# Patient Record
Sex: Male | Born: 1965 | Race: Black or African American | Hispanic: No | Marital: Married | Smoking: Current every day smoker
Health system: Southern US, Community
[De-identification: ages and names within clinical notes are randomized; demographics above are authoritative.]

## PROBLEM LIST (undated history)

## (undated) DIAGNOSIS — N400 Enlarged prostate without lower urinary tract symptoms: Secondary | ICD-10-CM

## (undated) DIAGNOSIS — N289 Disorder of kidney and ureter, unspecified: Secondary | ICD-10-CM

## (undated) DIAGNOSIS — A048 Other specified bacterial intestinal infections: Secondary | ICD-10-CM

## (undated) DIAGNOSIS — K259 Gastric ulcer, unspecified as acute or chronic, without hemorrhage or perforation: Secondary | ICD-10-CM

## (undated) DIAGNOSIS — K222 Esophageal obstruction: Secondary | ICD-10-CM

## (undated) DIAGNOSIS — I10 Essential (primary) hypertension: Secondary | ICD-10-CM

## (undated) DIAGNOSIS — K635 Polyp of colon: Secondary | ICD-10-CM

## (undated) DIAGNOSIS — R519 Headache, unspecified: Secondary | ICD-10-CM

## (undated) DIAGNOSIS — R51 Headache: Secondary | ICD-10-CM

## (undated) HISTORY — DX: Other specified bacterial intestinal infections: A04.8

## (undated) HISTORY — DX: Benign prostatic hyperplasia without lower urinary tract symptoms: N40.0

## (undated) HISTORY — DX: Polyp of colon: K63.5

## (undated) HISTORY — DX: Esophageal obstruction: K22.2

## (undated) HISTORY — DX: Disorder of kidney and ureter, unspecified: N28.9

## (undated) HISTORY — DX: Essential (primary) hypertension: I10

---

## 2011-11-04 ENCOUNTER — Emergency Department (HOSPITAL_COMMUNITY)
Admission: EM | Admit: 2011-11-04 | Discharge: 2011-11-04 | Disposition: A | Discharge status: Home / Self Care | Payer: Self-pay | Attending: Emergency Medicine | Admitting: Emergency Medicine

## 2011-11-04 ENCOUNTER — Emergency Department (HOSPITAL_COMMUNITY): Payer: Self-pay

## 2011-11-04 ENCOUNTER — Encounter (HOSPITAL_COMMUNITY): Payer: Self-pay | Admitting: *Deleted

## 2011-11-04 DIAGNOSIS — K259 Gastric ulcer, unspecified as acute or chronic, without hemorrhage or perforation: Secondary | ICD-10-CM | POA: Insufficient documentation

## 2011-11-04 DIAGNOSIS — Z79899 Other long term (current) drug therapy: Secondary | ICD-10-CM | POA: Insufficient documentation

## 2011-11-04 DIAGNOSIS — F17200 Nicotine dependence, unspecified, uncomplicated: Secondary | ICD-10-CM | POA: Insufficient documentation

## 2011-11-04 LAB — DIFFERENTIAL
Eosinophils Absolute: 0.1 10*3/uL (ref 0.0–0.7)
Lymphocytes Relative: 19 % (ref 12–46)
Lymphs Abs: 2.6 10*3/uL (ref 0.7–4.0)
Monocytes Relative: 3 % (ref 3–12)
Neutrophils Relative %: 78 % — ABNORMAL HIGH (ref 43–77)

## 2011-11-04 LAB — COMPREHENSIVE METABOLIC PANEL
ALT: 18 U/L (ref 0–53)
Alkaline Phosphatase: 62 U/L (ref 39–117)
BUN: 8 mg/dL (ref 6–23)
CO2: 31 mEq/L (ref 19–32)
GFR calc Af Amer: >90 mL/min (ref >90)
GFR calc non Af Amer: 79 mL/min — ABNORMAL LOW (ref >90)
Glucose, Bld: 96 mg/dL (ref 70–99)
Potassium: 4.3 mEq/L (ref 3.5–5.1)
Sodium: 141 mEq/L (ref 135–145)
Total Bilirubin: 0.5 mg/dL (ref 0.3–1.2)
Total Protein: 7.8 g/dL (ref 6.0–8.3)

## 2011-11-04 LAB — LIPASE, BLOOD: Lipase: 24 U/L (ref 11–59)

## 2011-11-04 LAB — CBC
Hemoglobin: 14.4 g/dL (ref 13.0–17.0)
MCH: 29.9 pg (ref 26.0–34.0)
MCV: 87.5 fL (ref 78.0–100.0)
Platelets: 224 10*3/uL (ref 150–400)
RBC: 4.81 MIL/uL (ref 4.22–5.81)
WBC: 13.9 10*3/uL — ABNORMAL HIGH (ref 4.0–10.5)

## 2011-11-04 MED ORDER — AMOXICILLIN 500 MG PO CAPS: 500.0000 mg | ORAL_CAPSULE | Freq: Three times a day (TID) | ORAL | Status: AC

## 2011-11-04 MED ORDER — ONDANSETRON HCL 4 MG/2ML IJ SOLN
4.0000 mg | Freq: Once | INTRAMUSCULAR | Status: AC
  Administered 2011-11-04: 4 mg via INTRAVENOUS
  Filled 2011-11-04: qty 2

## 2011-11-04 MED ORDER — FENTANYL CITRATE 0.05 MG/ML IJ SOLN
100.0000 ug | Freq: Once | INTRAMUSCULAR | Status: AC
  Administered 2011-11-04: 100 ug via INTRAVENOUS
  Filled 2011-11-04: qty 2

## 2011-11-04 MED ORDER — HYDROCODONE-ACETAMINOPHEN 5-325 MG PO TABS: 2.0000 | ORAL_TABLET | ORAL | Status: AC | PRN

## 2011-11-04 MED ORDER — METRONIDAZOLE 500 MG PO TABS: 500.0000 mg | ORAL_TABLET | Freq: Two times a day (BID) | ORAL | Status: AC

## 2011-11-04 MED ORDER — OMEPRAZOLE 20 MG PO CPDR: 20.0000 mg | DELAYED_RELEASE_CAPSULE | Freq: Every day | ORAL | Status: DC

## 2011-11-04 MED ORDER — IOHEXOL 300 MG/ML  SOLN
100.0000 mL | Freq: Once | INTRAMUSCULAR | Status: AC | PRN
  Administered 2011-11-04: 100 mL via INTRAVENOUS

## 2011-11-04 NOTE — ED Provider Notes (Signed)
History     CSN: [id number]  Arrival date & time 11/04/11  1557   First MD Initiated Contact with Patient 11/04/11 1646      Chief Complaint  Patient presents with  . Abdominal Pain     HPI Patient with history of abdominal pain for about two years but for the past few weeks it has been getting worse. Patient denies any n/v/d recently . Patient's pain is mostly on his upper mid abdomen area.  History reviewed. No pertinent past medical history.  History reviewed. No pertinent past surgical history.  History reviewed. No pertinent family history.  History  Substance Use Topics  . Smoking status: Current Everyday Smoker    Types: Cigarettes  . Smokeless tobacco: Not on file  . Alcohol Use: Yes      Review of Systems Negative except as noted in history of present illness Allergies  Review of patient's allergies indicates no known allergies.  Home Medications   Current Outpatient Rx  Name Route Sig Dispense Refill  . CALCIUM CARBONATE ANTACID 500 MG PO CHEW Oral Chew 1 tablet by mouth daily.    Marland Kitchen RANITIDINE HCL 150 MG PO TABS Oral Take 150 mg by mouth 2 (two) times daily.    . AMOXICILLIN 500 MG PO CAPS Oral Take 1 capsule (500 mg total) by mouth 3 (three) times daily. 42 capsule 0  . HYDROCODONE-ACETAMINOPHEN 5-325 MG PO TABS Oral Take 2 tablets by mouth every 4 (four) hours as needed for pain. 6 tablet 0  . OMEPRAZOLE 20 MG PO CPDR Oral Take 1 capsule (20 mg total) by mouth daily. 30 capsule 0    BP 130/84  Pulse 59  Temp(Src) 98.3 F (36.8 C) (Oral)  Resp 18  SpO2 98%  Physical Exam  Nursing note and vitals reviewed. Constitutional: He is oriented to person, place, and time. He appears well-developed and well-nourished. No distress.  HENT:  Head: Normocephalic and atraumatic.  Eyes: Pupils are equal, round, and reactive to light.  Neck: Normal range of motion.  Cardiovascular: Normal rate and intact distal pulses.   Pulmonary/Chest: No respiratory  distress.  Abdominal: Soft. Normal appearance and bowel sounds are normal. He exhibits no distension.    Musculoskeletal: Normal range of motion.  Neurological: He is alert and oriented to person, place, and time. No cranial nerve deficit.  Skin: Skin is warm and dry. No rash noted.  Psychiatric: He has a normal mood and affect. His behavior is normal.    ED Course  Procedures (including critical care time)  Labs Reviewed  CBC - Abnormal; Notable for the following:    WBC 13.9 (*)    All other components within normal limits  DIFFERENTIAL - Abnormal; Notable for the following:    Neutrophils Relative 78 (*)    Neutro Abs 10.8 (*)    All other components within normal limits  COMPREHENSIVE METABOLIC PANEL - Abnormal; Notable for the following:    Calcium 11.3 (*)    GFR calc non Af Amer 79 (*)    All other components within normal limits  LIPASE, BLOOD   Ct Abdomen Pelvis W Contrast  11/04/2011  *RADIOLOGY REPORT*  Clinical Data: 46 year old male with abdominal pain.  Mid upper abdomen affected.  CT ABDOMEN AND PELVIS WITH CONTRAST  Technique:  Multidetector CT imaging of the abdomen and pelvis was performed following the standard protocol during bolus administration of intravenous contrast.  Contrast: OMNIPAQUE IOHEXOL 300 MG/ML IV SOLN  Comparison: None.  Findings: Negative lung bases.  No pericardial or pleural effusion. The No acute osseous abnormality identified.  No pelvic free fluid.  Redundant sigmoid colon.  Otherwise negative distal colon.  Retained stool throughout much of the colon.  Normal appendix.  Normal terminal ileum.  Oral contrast has not yet reached the distal small bowel.  No dilated or inflamed small bowel loops.  The proximal stomach and gastroesophageal junction appear normal. Thickening of the gastric antrum may be physiologic. There is subtle stranding in the lesser sac at the level of the gastric antrum.  Pancreatic enhancement in this area appears  preserved. Nearby there are gastrohepatic ligament and root of mesentery lymph nodes which are at the upper limits of normal.  No free fluid, fluid collection, or pneumoperitoneum.  Duodenum within normal limits.  Negative gallbladder, liver, spleen, pancreas, adrenal glands, and portal venous system.  Major arterial structures are patent with minimal aortic atherosclerosis.  Kidneys are within normal limits for age; small left renal low density lesion likely is a simple cyst.  No lymphadenopathy.  IMPRESSION: 1.  Thickening of the gastric antrum which may be physiologic, but there is subtle inflammatory change in the adjacent lesser sac including reactive appearing lymph nodes.  Acute gastric ulcer disease cannot be excluded.  No evidence of perforation or pancreatitis. 2.  Otherwise negative for age CT of the abdomen pelvis.  Original Report Authenticated By: Harley Hallmark, M.D.     1. Gastric ulcer       MDM         Nelia Shi, MD 11/06/11 [phone]

## 2011-11-04 NOTE — ED Notes (Signed)
Patient with history of abdominal pain for about two years but for the past few weeks it has been getting worse.  Patient denies any n/v/d recently .  Patient's pain is mostly on his upper mid abdomen area.

## 2013-01-20 ENCOUNTER — Encounter (HOSPITAL_BASED_OUTPATIENT_CLINIC_OR_DEPARTMENT_OTHER): Payer: Self-pay

## 2013-01-20 ENCOUNTER — Emergency Department (HOSPITAL_BASED_OUTPATIENT_CLINIC_OR_DEPARTMENT_OTHER)
Admission: EM | Admit: 2013-01-20 | Discharge: 2013-01-20 | Disposition: A | Discharge status: Home / Self Care | Payer: 59 | Attending: Emergency Medicine | Admitting: Emergency Medicine

## 2013-01-20 ENCOUNTER — Emergency Department (HOSPITAL_BASED_OUTPATIENT_CLINIC_OR_DEPARTMENT_OTHER): Payer: 59

## 2013-01-20 DIAGNOSIS — F17200 Nicotine dependence, unspecified, uncomplicated: Secondary | ICD-10-CM | POA: Insufficient documentation

## 2013-01-20 DIAGNOSIS — E162 Hypoglycemia, unspecified: Secondary | ICD-10-CM

## 2013-01-20 LAB — COMPREHENSIVE METABOLIC PANEL
AST: 15 U/L (ref 0–37)
Albumin: 4.1 g/dL (ref 3.5–5.2)
Alkaline Phosphatase: 55 U/L (ref 39–117)
BUN: 14 mg/dL (ref 6–23)
Chloride: 106 mEq/L (ref 96–112)
Potassium: 3.9 mEq/L (ref 3.5–5.1)
Sodium: 141 mEq/L (ref 135–145)
Total Bilirubin: 1.1 mg/dL (ref 0.3–1.2)
Total Protein: 7.1 g/dL (ref 6.0–8.3)

## 2013-01-20 LAB — CBC WITH DIFFERENTIAL/PLATELET
Basophils Absolute: 0 10*3/uL (ref 0.0–0.1)
Basophils Relative: 0 % (ref 0–1)
Eosinophils Absolute: 0.1 10*3/uL (ref 0.0–0.7)
Hemoglobin: 14.7 g/dL (ref 13.0–17.0)
MCH: 30.5 pg (ref 26.0–34.0)
MCHC: 34.4 g/dL (ref 30.0–36.0)
Monocytes Relative: 5 % (ref 3–12)
Neutro Abs: 6.4 10*3/uL (ref 1.7–7.7)
Neutrophils Relative %: 78 % — ABNORMAL HIGH (ref 43–77)
Platelets: 176 10*3/uL (ref 150–400)
RDW: 13.8 % (ref 11.5–15.5)

## 2013-01-20 NOTE — ED Provider Notes (Signed)
History     CSN: [id number]  Arrival date & time 01/20/13  0804   First MD Initiated Contact with Patient 01/20/13 [phone]      Chief Complaint  Patient presents with  . Dizziness    (Consider location/radiation/quality/duration/timing/severity/associated sxs/prior treatment) The history is provided by the patient.   Patient here complaining of dizziness x3 days. Patient had sudden onset at rest when he felt the room was spinning. He denies any ear pain or visual changes. No unilateral weakness. No fever vomiting or neck pain. 90 chest pain shortness of breath. Symptoms have been intermittent and lasting up to one hour. No associated diaphoresis. No treatment used prior to arrival. Denies any severe headache at this time but does not a mild ache. History reviewed. No pertinent past medical history.  History reviewed. No pertinent past surgical history.  History reviewed. No pertinent family history.  History  Substance Use Topics  . Smoking status: Current Every Day Smoker    Types: Cigarettes  . Smokeless tobacco: Not on file  . Alcohol Use: Yes      Review of Systems  All other systems reviewed and are negative.    Allergies  Review of patient's allergies indicates no known allergies.  Home Medications   Current Outpatient Rx  Name  Route  Sig  Dispense  Refill  . calcium carbonate (TUMS - DOSED IN MG ELEMENTAL CALCIUM) 500 MG chewable tablet   Oral   Chew 1 tablet by mouth daily.         Marland Kitchen EXPIRED: omeprazole (PRILOSEC) 20 MG capsule   Oral   Take 1 capsule (20 mg total) by mouth daily.   30 capsule   0   . ranitidine (ZANTAC) 150 MG tablet   Oral   Take 150 mg by mouth 2 (two) times daily.           BP 118/77  Pulse 70  Temp(Src) 98.2 F (36.8 C) (Oral)  Resp 16  SpO2 100%  Physical Exam  Nursing note and vitals reviewed. Constitutional: He is oriented to person, place, and time. He appears well-developed and well-nourished.  Non-toxic  appearance. No distress.  HENT:  Head: Normocephalic and atraumatic.  Eyes: Conjunctivae, EOM and lids are normal. Pupils are equal, round, and reactive to light.  Neck: Normal range of motion. Neck supple. No tracheal deviation present. No mass present.  Cardiovascular: Normal rate, regular rhythm and normal heart sounds.  Exam reveals no gallop.   No murmur heard. Pulmonary/Chest: Effort normal and breath sounds normal. No stridor. No respiratory distress. He has no decreased breath sounds. He has no wheezes. He has no rhonchi. He has no rales.  Abdominal: Soft. Normal appearance and bowel sounds are normal. He exhibits no distension. There is no tenderness. There is no rebound and no CVA tenderness.  Musculoskeletal: Normal range of motion. He exhibits no edema and no tenderness.  Neurological: He is alert and oriented to person, place, and time. He has normal strength. No cranial nerve deficit or sensory deficit. GCS eye subscore is 4. GCS verbal subscore is 5. GCS motor subscore is 6.  Skin: Skin is warm and dry. No abrasion and no rash noted.  Psychiatric: He has a normal mood and affect. His speech is normal and behavior is normal.    ED Course  Procedures (including critical care time)  Labs Reviewed - No data to display No results found.   No diagnosis found.    MDM  Pt given  food for low blood sugar--labs and xrays reviewed in no acute findings noted with the exception of blood sugar. No concern for ACS or TIA. Patient stable for discharge        Toy Baker, MD 01/20/13 [phone]

## 2013-01-20 NOTE — ED Notes (Signed)
Pt states that he has been "foggy upstairs".  Pt c/o dizziness x several days.  Denies any nausea and vomiting, fever, chills, cough, congestion.

## 2013-12-18 DIAGNOSIS — R972 Elevated prostate specific antigen [PSA]: Secondary | ICD-10-CM | POA: Insufficient documentation

## 2014-05-11 ENCOUNTER — Emergency Department (HOSPITAL_BASED_OUTPATIENT_CLINIC_OR_DEPARTMENT_OTHER)
Admission: EM | Admit: 2014-05-11 | Discharge: 2014-05-11 | Disposition: A | Discharge status: Home / Self Care | Payer: 59 | Attending: Emergency Medicine | Admitting: Emergency Medicine

## 2014-05-11 ENCOUNTER — Emergency Department (HOSPITAL_BASED_OUTPATIENT_CLINIC_OR_DEPARTMENT_OTHER): Payer: 59

## 2014-05-11 ENCOUNTER — Encounter (HOSPITAL_BASED_OUTPATIENT_CLINIC_OR_DEPARTMENT_OTHER): Payer: Self-pay | Admitting: Emergency Medicine

## 2014-05-11 DIAGNOSIS — Z8719 Personal history of other diseases of the digestive system: Secondary | ICD-10-CM | POA: Diagnosis not present

## 2014-05-11 DIAGNOSIS — F17200 Nicotine dependence, unspecified, uncomplicated: Secondary | ICD-10-CM | POA: Insufficient documentation

## 2014-05-11 DIAGNOSIS — R1011 Right upper quadrant pain: Secondary | ICD-10-CM | POA: Insufficient documentation

## 2014-05-11 DIAGNOSIS — Z79899 Other long term (current) drug therapy: Secondary | ICD-10-CM | POA: Insufficient documentation

## 2014-05-11 HISTORY — DX: Headache, unspecified: R51.9

## 2014-05-11 HISTORY — DX: Gastric ulcer, unspecified as acute or chronic, without hemorrhage or perforation: K25.9

## 2014-05-11 HISTORY — DX: Headache: R51

## 2014-05-11 LAB — CBC WITH DIFFERENTIAL/PLATELET
BASOS ABS: 0 10*3/uL (ref 0.0–0.1)
BASOS PCT: 0 % (ref 0–1)
EOS ABS: 0.1 10*3/uL (ref 0.0–0.7)
Eosinophils Relative: 2 % (ref 0–5)
HCT: 41.2 % (ref 39.0–52.0)
Hemoglobin: 14 g/dL (ref 13.0–17.0)
LYMPHS ABS: 2 10*3/uL (ref 0.7–4.0)
Lymphocytes Relative: 25 % (ref 12–46)
MCH: 30.4 pg (ref 26.0–34.0)
MCHC: 34 g/dL (ref 30.0–36.0)
MCV: 89.4 fL (ref 78.0–100.0)
Monocytes Absolute: 0.4 10*3/uL (ref 0.1–1.0)
Monocytes Relative: 5 % (ref 3–12)
NEUTROS PCT: 69 % (ref 43–77)
Neutro Abs: 5.5 10*3/uL (ref 1.7–7.7)
PLATELETS: 200 10*3/uL (ref 150–400)
RBC: 4.61 MIL/uL (ref 4.22–5.81)
RDW: 14 % (ref 11.5–15.5)
WBC: 8 10*3/uL (ref 4.0–10.5)

## 2014-05-11 LAB — URINALYSIS, ROUTINE W REFLEX MICROSCOPIC
BILIRUBIN URINE: NEGATIVE
Glucose, UA: NEGATIVE mg/dL
Hgb urine dipstick: NEGATIVE
Ketones, ur: NEGATIVE mg/dL
Leukocytes, UA: NEGATIVE
NITRITE: NEGATIVE
PROTEIN: NEGATIVE mg/dL
SPECIFIC GRAVITY, URINE: 1.024 (ref 1.005–1.030)
UROBILINOGEN UA: 1 mg/dL (ref 0.0–1.0)
pH: 5.5 (ref 5.0–8.0)

## 2014-05-11 LAB — COMPREHENSIVE METABOLIC PANEL
ALBUMIN: 4.1 g/dL (ref 3.5–5.2)
ALK PHOS: 59 U/L (ref 39–117)
ALT: 24 U/L (ref 0–53)
AST: 19 U/L (ref 0–37)
Anion gap: 11 (ref 5–15)
BUN: 11 mg/dL (ref 6–23)
CALCIUM: 10.3 mg/dL (ref 8.4–10.5)
CO2: 25 mEq/L (ref 19–32)
Chloride: 104 mEq/L (ref 96–112)
Creatinine, Ser: 1 mg/dL (ref 0.50–1.35)
GFR calc Af Amer: >90 mL/min (ref >90)
GFR calc non Af Amer: 88 mL/min — ABNORMAL LOW (ref >90)
Glucose, Bld: 105 mg/dL — ABNORMAL HIGH (ref 70–99)
POTASSIUM: 4.1 meq/L (ref 3.7–5.3)
SODIUM: 140 meq/L (ref 137–147)
TOTAL PROTEIN: 7.8 g/dL (ref 6.0–8.3)
Total Bilirubin: 0.4 mg/dL (ref 0.3–1.2)

## 2014-05-11 LAB — LIPASE, BLOOD: Lipase: 34 U/L (ref 11–59)

## 2014-05-11 MED ORDER — CLARITHROMYCIN 500 MG PO TABS: 500.0000 mg | ORAL_TABLET | Freq: Two times a day (BID) | ORAL | Status: DC

## 2014-05-11 MED ORDER — PANTOPRAZOLE SODIUM 20 MG PO TBEC: 20.0000 mg | DELAYED_RELEASE_TABLET | Freq: Every day | ORAL | Status: DC

## 2014-05-11 MED ORDER — GI COCKTAIL ~~LOC~~
30.0000 mL | Freq: Once | ORAL | Status: AC
  Administered 2014-05-11: 30 mL via ORAL
  Filled 2014-05-11: qty 30

## 2014-05-11 MED ORDER — AMOXICILLIN 500 MG PO CAPS: 500.0000 mg | ORAL_CAPSULE | Freq: Three times a day (TID) | ORAL | Status: DC

## 2014-05-11 NOTE — Discharge Instructions (Signed)
Protonix as prescribed.  Followup with your primary Dr. if not improving in the next week, and return to the ER if you develop any new and concerning symptoms.   Abdominal Pain Many things can cause abdominal pain. Usually, abdominal pain is not caused by a disease and will improve without treatment. It can often be observed and treated at home. Your health care provider will do a physical exam and possibly order blood tests and X-rays to help determine the seriousness of your pain. However, in many cases, more time must pass before a clear cause of the pain can be found. Before that point, your health care provider may not know if you need more testing or further treatment. HOME CARE INSTRUCTIONS  Monitor your abdominal pain for any changes. The following actions may help to alleviate any discomfort you are experiencing:  Only take over-the-counter or prescription medicines as directed by your health care provider.  Do not take laxatives unless directed to do so by your health care provider.  Try a clear liquid diet (broth, tea, or water) as directed by your health care provider. Slowly move to a bland diet as tolerated. SEEK MEDICAL CARE IF:  You have unexplained abdominal pain.  You have abdominal pain associated with nausea or diarrhea.  You have pain when you urinate or have a bowel movement.  You experience abdominal pain that wakes you in the night.  You have abdominal pain that is worsened or improved by eating food.  You have abdominal pain that is worsened with eating fatty foods.  You have a fever. SEEK IMMEDIATE MEDICAL CARE IF:   Your pain does not go away within 2 hours.  You keep throwing up (vomiting).  Your pain is felt only in portions of the abdomen, such as the right side or the left lower portion of the abdomen.  You pass bloody or black tarry stools. MAKE SURE YOU:  Understand these instructions.   Will watch your condition.   Will get help right  away if you are not doing well or get worse.  Document Released: 07/05/2005 Document Revised: 09/30/2013 Document Reviewed: 06/04/2013 [facility] Patient Information 2015 [place], [place]. This information is not intended to replace advice given to you by your health care provider. Make sure you discuss any questions you have with your health care provider.

## 2014-05-11 NOTE — ED Notes (Signed)
Patient transported to Ultrasound 

## 2014-05-11 NOTE — ED Provider Notes (Signed)
CSN: [id number]     Arrival date & time 05/11/14  0740 History   First MD Initiated Contact with Patient 05/11/14 [phone]     Chief Complaint  Patient presents with  . Abdominal Pain     (Consider location/radiation/quality/duration/timing/severity/associated sxs/prior Treatment) HPI Comments: Patient is a 48 year old male with history of gastric ulcers. He presents with complaints of right-sided abdominal pain that has been occurring intermittently for the past 2 months. He states his pain is worsening despite taking Zantac daily. He denies any fevers or chills. He denies any bloody or black stools. He states he has a history of gastric ulcers and this is identical to what he experienced at that time.  Patient is a 48 y.o. male presenting with abdominal pain. The history is provided by the patient.  Abdominal Pain Pain location:  RUQ Pain quality: burning   Pain radiates to:  Does not radiate Pain severity:  Moderate Onset quality:  Gradual Duration: 2 months. Timing:  Intermittent Progression:  Worsening Chronicity:  Recurrent Context: not alcohol use and not suspicious food intake   Relieved by:  Nothing Worsened by:  Nothing tried Ineffective treatments:  None tried Associated symptoms: no belching, no dysuria, no hematochezia and no melena     Past Medical History  Diagnosis Date  . Multiple gastric ulcers   . Frequent headaches    History reviewed. No pertinent past surgical history. No family history on file. History  Substance Use Topics  . Smoking status: Current Every Day Smoker    Types: Cigarettes  . Smokeless tobacco: Not on file  . Alcohol Use: Yes    Review of Systems  Gastrointestinal: Positive for abdominal pain. Negative for melena and hematochezia.  Genitourinary: Negative for dysuria.  All other systems reviewed and are negative.     Allergies  Review of patient's allergies indicates no known allergies.  Home Medications   Prior to Admission  medications   Medication Sig Start Date End Date Taking? Authorizing Provider  calcium carbonate (TUMS - DOSED IN MG ELEMENTAL CALCIUM) 500 MG chewable tablet Chew 1 tablet by mouth daily.   Yes Historical Provider, MD  ranitidine (ZANTAC) 150 MG tablet Take 150 mg by mouth 2 (two) times daily.   Yes Historical Provider, MD  omeprazole (PRILOSEC) 20 MG capsule Take 1 capsule (20 mg total) by mouth daily. 11/04/11 11/03/12  Dot Lanes, MD   BP 122/94  Pulse 75  Temp(Src) 98.4 F (36.9 C) (Oral)  Resp 20  Ht 5\' 9"  (1.753 m)  Wt 211 lb (95.709 kg)  BMI 31.15 kg/m2  SpO2 100% Physical Exam  Nursing note and vitals reviewed. Constitutional: He is oriented to person, place, and time. He appears well-developed and well-nourished. No distress.  HENT:  Head: Normocephalic and atraumatic.  Mouth/Throat: Oropharynx is clear and moist.  Neck: Normal range of motion. Neck supple.  Cardiovascular: Normal rate, regular rhythm and normal heart sounds.   No murmur heard. Pulmonary/Chest: Effort normal and breath sounds normal. No respiratory distress. He has no wheezes.  Abdominal: Soft. Bowel sounds are normal. He exhibits no distension and no mass. There is tenderness.  There is mild tenderness to the right upper quadrant. There is no rebound and no guarding. Bowel sounds are present.  Musculoskeletal: Normal range of motion. He exhibits no edema.  Lymphadenopathy:    He has no cervical adenopathy.  Neurological: He is alert and oriented to person, place, and time.  Skin: Skin is warm and dry. He  is not diaphoretic.    ED Course  Procedures (including critical care time) Labs Review Labs Reviewed  URINALYSIS, ROUTINE W REFLEX MICROSCOPIC  CBC WITH DIFFERENTIAL  COMPREHENSIVE METABOLIC PANEL  LIPASE, BLOOD    Imaging Review No results found.   EKG Interpretation None      MDM   Final diagnoses:  None    Patient presents with complaints of right upper quadrant pain that he  feels is similar to his prior ulcer. Workup reveals no abnormalities within the laboratory studies and ultrasound of right upper quadrant is unremarkable as well. He did not experience much relief with a GI cocktail. As his symptoms are similar to before and workup is thus far unremarkable, he will be treated with Protonix and when necessary followup. I highly doubt appendicitis as the presentation is not typical for this, there is no white count, and there is no tenderness to my exam in the right lower quadrant.    Veryl Speak, MD 05/11/14 1040

## 2014-05-11 NOTE — ED Notes (Signed)
Patient states he has a history of a gastric ulcer.  For the last two months, he has had pain RLQ of his abdomen, states the pain is increasing.  States he has has some nausea.

## 2014-10-15 ENCOUNTER — Emergency Department (HOSPITAL_BASED_OUTPATIENT_CLINIC_OR_DEPARTMENT_OTHER)
Admission: EM | Admit: 2014-10-15 | Discharge: 2014-10-15 | Disposition: A | Discharge status: Home / Self Care | Payer: 59 | Attending: Emergency Medicine | Admitting: Emergency Medicine

## 2014-10-15 ENCOUNTER — Encounter (HOSPITAL_BASED_OUTPATIENT_CLINIC_OR_DEPARTMENT_OTHER): Payer: Self-pay | Admitting: *Deleted

## 2014-10-15 DIAGNOSIS — Z8719 Personal history of other diseases of the digestive system: Secondary | ICD-10-CM | POA: Insufficient documentation

## 2014-10-15 DIAGNOSIS — Z79899 Other long term (current) drug therapy: Secondary | ICD-10-CM | POA: Insufficient documentation

## 2014-10-15 DIAGNOSIS — Z720 Tobacco use: Secondary | ICD-10-CM | POA: Insufficient documentation

## 2014-10-15 DIAGNOSIS — Z792 Long term (current) use of antibiotics: Secondary | ICD-10-CM | POA: Insufficient documentation

## 2014-10-15 DIAGNOSIS — R51 Headache: Secondary | ICD-10-CM | POA: Diagnosis present

## 2014-10-15 DIAGNOSIS — G4489 Other headache syndrome: Secondary | ICD-10-CM | POA: Diagnosis not present

## 2014-10-15 MED ORDER — FLUTICASONE PROPIONATE 50 MCG/ACT NA SUSP: 2.0000 | Freq: Every day | NASAL | Status: DC

## 2014-10-15 MED ORDER — CLARITHROMYCIN 500 MG PO TABS: 500.0000 mg | ORAL_TABLET | Freq: Two times a day (BID) | ORAL | Status: DC

## 2014-10-15 MED ORDER — FEXOFENADINE HCL 60 MG PO TABS: 60.0000 mg | ORAL_TABLET | Freq: Two times a day (BID) | ORAL | Status: DC

## 2014-10-15 NOTE — Discharge Instructions (Signed)
General Headache Without Cause  Since you have had a headache for 2 years and a cause is not clear at this point in time, you will have to follow up with a specialist. Make an appointment with a family doctor for next week.   A headache is pain or discomfort felt around the head or neck area. The specific cause of a headache may not be found. There are many causes and types of headaches. A few common ones are:  Tension headaches.  Migraine headaches.  Cluster headaches.  Chronic daily headaches. HOME CARE INSTRUCTIONS   Keep all follow-up appointments with your caregiver or any specialist referral.  Only take over-the-counter or prescription medicines for pain or discomfort as directed by your caregiver.  Lie down in a dark, quiet room when you have a headache.  Keep a headache journal to find out what may trigger your migraine headaches. For example, write down:  What you eat and drink.  How much sleep you get.  Any change to your diet or medicines.  Try massage or other relaxation techniques.  Put ice packs or heat on the head and neck. Use these 3 to 4 times per day for 15 to 20 minutes each time, or as needed.  Limit stress.  Sit up straight, and do not tense your muscles.  Quit smoking if you smoke.  Limit alcohol use.  Decrease the amount of caffeine you drink, or stop drinking caffeine.  Eat and sleep on a regular schedule.  Get 7 to 9 hours of sleep, or as recommended by your caregiver.  Keep lights dim if bright lights bother you and make your headaches worse. SEEK MEDICAL CARE IF:   You have problems with the medicines you were prescribed.  Your medicines are not working.  You have a change from the usual headache.  You have nausea or vomiting. SEEK IMMEDIATE MEDICAL CARE IF:   Your headache becomes severe.  You have a fever.  You have a stiff neck.  You have loss of vision.  You have muscular weakness or loss of muscle control.  You start  losing your balance or have trouble walking.  You feel faint or pass out.  You have severe symptoms that are different from your first symptoms. MAKE SURE YOU:   Understand these instructions.  Will watch your condition.  Will get help right away if you are not doing well or get worse. Document Released: 09/25/2005 Document Revised: 12/18/2011 Document Reviewed: 10/11/2011 [facility] Patient Information 2015 [place], [place]. This information is not intended to replace advice given to you by your health care provider. Make sure you discuss any questions you have with your health care provider.  Possible Sinus Headache A sinus headache is when your sinuses become clogged or swollen. Sinus headaches can range from mild to severe.  CAUSES A sinus headache can have different causes, such as:  Colds.  Sinus infections.  Allergies. SYMPTOMS  Symptoms of a sinus headache may vary and can include:  Headache.  Pain or pressure in the face.  Congested or runny nose.  Fever.  Inability to smell.  Pain in upper teeth. Weather changes can make symptoms worse. TREATMENT  The treatment of a sinus headache depends on the cause.  Sinus pain caused by a sinus infection may be treated with antibiotic medicine.  Sinus pain caused by allergies may be helped by allergy medicines (antihistamines) and medicated nasal sprays.  Sinus pain caused by congestion may be helped by flushing the  nose and sinuses with saline solution. HOME CARE INSTRUCTIONS   If antibiotics are prescribed, take them as directed. Finish them even if you start to feel better.  Only take over-the-counter or prescription medicines for pain, discomfort, or fever as directed by your caregiver.  If you have congestion, use a nasal spray to help reduce pressure. SEEK IMMEDIATE MEDICAL CARE IF:  You have a fever.  You have headaches more than once a week.  You have sensitivity to light or sound.  You have repeated  nausea and vomiting.  You have vision problems.  You have sudden, severe pain in your face or head.  You have a seizure.  You are confused.  Your sinus headaches do not get better after treatment. Many people think they have a sinus headache when they actually have migraines or tension headaches. MAKE SURE YOU:   Understand these instructions.  Will watch your condition.  Will get help right away if you are not doing well or get worse. Document Released: 11/02/2004 Document Revised: 12/18/2011 Document Reviewed: 12/24/2010 [facility] Patient Information 2015 [place], [place]. This information is not intended to replace advice given to you by your health care provider. Make sure you discuss any questions you have with your health care provider.  Emergency Department Resource Guide 1) Find a Doctor and Pay Out of Pocket Although you won't have to find out who is covered by your insurance plan, it is a good idea to ask around and get recommendations. You will then need to call the office and see if the doctor you have chosen will accept you as a new patient and what types of options they offer for patients who are self-pay. Some doctors offer discounts or will set up payment plans for their patients who do not have insurance, but you will need to ask so you aren't surprised when you get to your appointment.  2) Contact Your Local Health Department Not all health departments have doctors that can see patients for sick visits, but many do, so it is worth a call to see if yours does. If you don't know where your local health department is, you can check in your phone book. The CDC also has a tool to help you locate your state's health department, and many state websites also have listings of all of their local health departments.  3) Find a [facility] If your illness is not likely to be very severe or complicated, you may want to try a walk in clinic. These are popping up all over the country  in pharmacies, drugstores, and shopping centers. They're usually staffed by nurse practitioners or physician assistants that have been trained to treat common illnesses and complaints. They're usually fairly quick and inexpensive. However, if you have serious medical issues or chronic medical problems, these are probably not your best option.  No Primary Care Doctor: - Call Health Connect at  [phone] - they can help you locate a primary care doctor that  accepts your insurance, provides certain services, etc. - Physician Referral Service- [phone]  Chronic Pain Problems: Organization         Address  Phone   Notes  [facility]  [phone] Patients need to be referred by their primary care doctor.   Medication Assistance: Organization         Address  Phone   Notes  [facility] Medication [facility] [address] [place], [place] [address] [phone] --Must be  a resident of [facility] -- Must have NO insurance coverage whatsoever (no Medicaid/ Medicare, etc.) -- The pt. MUST have a primary care doctor that directs their care regularly and follows them in the community   MedAssist  [phone]   [employer]  [phone]    Agencies that provide inexpensive medical care: Organization         Address  Phone   Notes  [place]  [phone]   [facility]    [phone]   [facility] [place], [place] [address] [phone]   [place] [address] [place] [phone]   Planned Parenthood    [phone]   [facility]    [phone]   [place] [address], [place] Phone:  [phone], Fax:  [phone] Hours of Operation:  9 am - 6 pm, M-F.  Also accepts Medicaid/Medicare and self-pay.  [facility] for [address], [address],  [place] Phone: [phone], Fax: [phone]. Hours of Operation:  8:30 am - 5:30 pm, M-F.  Also accepts Medicaid and self-pay.  [facility] [place] [address], [place] Phone: [phone]   [place], [place], [place] [phone], Ext. [phone] Mondays & Thursdays: 7-9 AM.  First 15 patients are seen on a first come, first serve basis.    [place] Providers:  Organization         Address  Phone   Notes  [facility] [address], [place] [phone] Also accepts self-pay patients.  [facility] [address], [place]  [phone]   [place], [address], [place] [phone]   [facility] [address] [place] [phone]   Lucianne Lei [address], [place]   [phone] Only accepts [place] Access [place] patients after they have their name applied to their card.   Self-Pay (no insurance) in [facility]:  Organization         Address  Phone   Notes  Sickle Cell Patients, [facility] [place] [phone]   [facility] [place] [phone]   [facility] [employer]  [place], [place], [place] [phone]   [facility]/Dr. Osei-Bonsu  [address] [place] or [address], [place] [phone] Phone number for both [place] and [place] locations is the same.  [facility] [address], [place] [phone]   [facility] [address], [place] or [address] [phone]   [facility] [address], [place] [phone], phone; [phone], fax Sees patients 1st and 3rd Saturday of every month.  Must not  qualify for public or private insurance (i.e. Medicaid, Medicare, [place] Health Choice, Veterans' Benefits)  Household income should be no more than 200% of the poverty level The clinic cannot treat you if you are pregnant or think you are pregnant  Sexually transmitted diseases are not treated at the clinic.    Dental Care: Organization  Address  Phone  Notes  [facility] [place], [place] [phone] Accepts children up to age 48 who are enrolled in [place] or [place]; pregnant women with a Medicaid card; and children who have applied for Medicaid or [place] Health Choice, but were declined, whose parents can pay a reduced fee at time of service.  [facility] Department of [facility]  [address], [place] [phone] Accepts children up to age 13 who are enrolled in [place] or [place]; pregnant women with a Medicaid card; and children who have applied for Medicaid or [place] Health Choice, but were declined, whose parents can pay a reduced fee at time of service.  [facility] Adult Dental Access PROGRAM  [place] [phone] Patients are seen by appointment only. Walk-ins are not accepted. [place] will see patients 31 years of age and older. Monday - Tuesday (8am-5pm) Most Wednesdays (8:30-5pm) $30 per visit, cash only  [facility] Adult Dental Access PROGRAM  [address], [facility] [phone] Patients are seen by appointment only. Walk-ins are not accepted. [place] will see patients 55 years of age and older. One Wednesday Evening (Monthly: Volunteer Based).  $30 per visit, cash only  [place]  [phone] for adults; Children under age 63, call Graduate Pediatric Dentistry at [phone]. Children aged 72-14, please call [phone] to request a pediatric application.  Dental services are provided  in all areas of dental care including fillings, crowns and bridges, complete and partial dentures, implants, gum treatment, root canals, and extractions. Preventive care is also provided. Treatment is provided to both adults and children. Patients are selected via a lottery and there is often a waiting list.   [facility] [address], [place]  [phone] [email or web address]   [facility] [address] [place], [place] [phone], Ext. [phone] Second and Fourth Thursday of each month, opens at 6:30 AM; Clinic ends at 9 AM.  Patients are seen on a first-come first-served basis, and a limited number are seen during each clinic.   [facility]  [address] Hillard Danker [place], [place] [phone]   Eligibility Requirements You must have lived in [place], [place], or [place] counties for at least the last three months.   You cannot be eligible for state or federal sponsored [employer], including [employer], [place], or [employer].   You generally cannot be eligible for healthcare insurance through your employer.    How to apply: Eligibility screenings are held every Tuesday and Wednesday afternoon from 1:00 pm until 4:00 pm. You do not need an appointment for the interview!  [facility] [address] [place], [place]   [place]  [place] Department  [place]  [phone]    Behavioral Health Resources in the Community: Intensive Outpatient Programs Organization         Address  Phone  Notes  [place]. [address], [place], [place] [phone]   [facility] Outpatient [address], [place], [place]   ADS: Alcohol & Drug Svcs [address] [place], [place]   [place] [address]  [place], [place] or [phone]   Substance Abuse Resources  Organization         Address  Phone  Notes  Alcohol and Drug Services  [place]  [phone]   [facility] [place]  [phone]   [place] Pester  [phone]   Residential & Outpatient Substance Abuse Program  [phone]   Psychological Services Organization         Address  Phone  Notes  [facility] [place]  Media  [phone]   [place] [address], [place] or [phone]    Mobile Crisis Teams Organization         Address  Phone  Notes  [facility]atives, Mobile Crisis Care Unit  [phone]   [facility]  [address] [place], [place]   [place] [address] [place] [phone]    Self-Help/Support Groups Organization         Address  Phone             Notes  [place]. of [place] - variety of support groups  [place] Call for more information  Narcotics Anonymous (NA), [facility]aring [facility] [address], [employer] [place]  2 meetings at this location   Special educational needs teacher         Address  Phone  Notes  ASAP Residential Treatment [place],    [place]  [phone]   [facility]  [address] [place] [phone], [place], [place]   [place], [place] [phone] Admissions: 8am-3pm M-F  Incentives Substance [place] [address],    [place], [place] [phone]   [facility] [address] [place], [place], [place]   [facility] [address],  [place], [place]   Insight Programs - Intensive Outpatient [address], [place], [place]   [facility] ([place].,  [place], [place] [phone] or  [phone]   [facility] Services (RTS) [address] [place], [place] Accepts Medicaid  [facility] [place] [address]  [place] [phone] Substance Abuse/Addiction Treatment   [facility] Organization         Address  Phone  Notes  [facility]  [phone]   Domenic Schwab, PhD [address] Arlis Porta [place], [place]   [phone] or [phone]   [place] [address] [place], [place] [phone]   [facility] [address] [place], [place] [phone] Insurance/Medicaid/sponsorship through [facility] and Families [address], [address]                                    [place], [place] [phone] [place] [address], [place] [phone]    Dr. Adele Schilder  [phone]   [facility] Dept. 1) [address], [place] 2) [place] 3)  [place] [address], [place] [phone]  [phone]   [place] [phone] or [phone] (After Hours)

## 2014-10-15 NOTE — ED Notes (Signed)
Patient states he has had daily headaches for the last two years.  Describes the pain as a constant aching pain and is located in the posterior head.  States he has seen a [facility] Luciana [facility] and felt that she did not do anything.  Has not seen her in one year.  States at times the headache cause a lightheadedness and he feels like he may pass out.  Denies any syncope episodes.  States his mother had a similar problem and was diagnosed with a brain tumor.  States he wants to find out what is wrong with him.

## 2014-10-15 NOTE — ED Provider Notes (Signed)
CSN: [id number]     Arrival date & time 10/15/14  0272 History   First MD Initiated Contact with Patient 10/15/14 0901     Chief Complaint  Patient presents with  . Headache     (Consider location/radiation/quality/duration/timing/severity/associated sxs/prior Treatment) HPI Patient has had headaches for 2 years. He reports they're nearly daily. He described them as being generalized and aching. They're somewhat worse in the back of his head. Sometimes the headaches are worse in the morning. However yesterday he reports that he seemed well in the morning and by afternoon after lunch he developed a headache. He does not have fevers he does not have nausea or vomiting he does not have visual changes with them. He reports he does feel dizzy sometimes but does not have difficulty walking. He reports he does have a "bad sinuses". He however denies the headaches are significantly worse by position change. He describes having had a CT scan about 2 years ago that did not show any abnormality. He reports he was referred to a neurologist in the past but he could not afford to go. He usually takes an over-the-counter migraine medication for his headaches. Past Medical History  Diagnosis Date  . Multiple gastric ulcers   . Frequent headaches    History reviewed. No pertinent past surgical history. No family history on file. History  Substance Use Topics  . Smoking status: Current Every Day Smoker    Types: Cigarettes  . Smokeless tobacco: Never Used  . Alcohol Use: Yes     Comment: occassional    Review of Systems 10 Systems reviewed and are negative for acute change except as noted in the HPI.   Allergies  Review of patient's allergies indicates no known allergies.  Home Medications   Prior to Admission medications   Medication Sig Start Date End Date Taking? Authorizing Provider  calcium carbonate (TUMS - DOSED IN MG ELEMENTAL CALCIUM) 500 MG chewable tablet Chew 1 tablet by mouth daily.    Yes Historical Provider, MD  ranitidine (ZANTAC) 150 MG tablet Take 150 mg by mouth 2 (two) times daily.   Yes Historical Provider, MD  amoxicillin (AMOXIL) 500 MG capsule Take 1 capsule (500 mg total) by mouth 3 (three) times daily. 05/11/14   Veryl Speak, MD  clarithromycin (BIAXIN) 500 MG tablet Take 1 tablet (500 mg total) by mouth 2 (two) times daily. 05/11/14   Veryl Speak, MD  clarithromycin (BIAXIN) 500 MG tablet Take 1 tablet (500 mg total) by mouth 2 (two) times daily. 10/15/14   Charlesetta Shanks, MD  fexofenadine (ALLEGRA) 60 MG tablet Take 1 tablet (60 mg total) by mouth 2 (two) times daily. 10/15/14   Charlesetta Shanks, MD  fluticasone (FLONASE) 50 MCG/ACT nasal spray Place 2 sprays into both nostrils daily. 10/15/14   Charlesetta Shanks, MD  omeprazole (PRILOSEC) 20 MG capsule Take 1 capsule (20 mg total) by mouth daily. 11/04/11 10/15/14  Dot Lanes, MD  pantoprazole (PROTONIX) 20 MG tablet Take 1 tablet (20 mg total) by mouth daily. 05/11/14   Veryl Speak, MD   BP 157/100 mmHg  Pulse 83  Temp(Src) 98 F (36.7 C) (Oral)  Resp 16  Ht 5\' 9"  (1.753 m)  Wt 220 lb (99.791 kg)  BMI 32.47 kg/m2  SpO2 97% Physical Exam  Constitutional: He is oriented to person, place, and time. He appears well-developed and well-nourished.  HENT:  Head: Normocephalic and atraumatic.  Bilateral TMs no erythema or bulging. Very mild dullness however no effusions  present. Mild frontal tenderness to percussion. Dentition and oropharynx normal.  Eyes: EOM are normal. Pupils are equal, round, and reactive to light. Right eye exhibits no discharge. Left eye exhibits no discharge. No scleral icterus.  Neck: Neck supple.  Cardiovascular: Normal rate, regular rhythm, normal heart sounds and intact distal pulses.   Pulmonary/Chest: Effort normal and breath sounds normal.  Abdominal: Soft. Bowel sounds are normal. He exhibits no distension. There is no tenderness.  Musculoskeletal: Normal range of motion. He exhibits no  edema.  Neurological: He is alert and oriented to person, place, and time. He has normal strength. No cranial nerve deficit. He exhibits normal muscle tone. Coordination normal. GCS eye subscore is 4. GCS verbal subscore is 5. GCS motor subscore is 6.  Skin: Skin is warm, dry and intact.  Psychiatric: He has a normal mood and affect.    ED Course  Procedures (including critical care time) Labs Review Labs Reviewed - No data to display  Imaging Review No results found.   EKG Interpretation None      MDM   Final diagnoses:  Other headache syndrome   I do not feel further diagnostic testing would be helpful in identifying the source of the patient's headaches. He is well in appearance without abnormal neurologic examination. EMR review shows CT results from under a year ago. At this point the most salient feature of his history is for frontal headaches and report of nasal drainage. There does not appear to be a work associated etiology other than wearing safety glasses. There also does not appear to be any classic pattern to his headaches. I have counseled the patient that although previously he could not afford his referral to neurology, this would be the most appropriate and effective next step and chronic headache evaluation. Based on symptoms of frontal pressure and flowing of fluids from his nasal passage is described by him, I will prescribe an appear course of treatment for chronic sinusitis. The patient is counseled to observe for response to this and schedule a follow-up with a family physician for ongoing referral and evaluation.    Charlesetta Shanks, MD 10/16/14 [phone]

## 2014-11-26 ENCOUNTER — Encounter: Payer: Self-pay | Admitting: *Deleted

## 2014-12-01 ENCOUNTER — Ambulatory Visit (INDEPENDENT_AMBULATORY_CARE_PROVIDER_SITE_OTHER): Payer: 59 | Admitting: Neurology

## 2014-12-01 ENCOUNTER — Encounter: Payer: Self-pay | Admitting: Neurology

## 2014-12-01 ENCOUNTER — Telehealth: Payer: Self-pay | Admitting: *Deleted

## 2014-12-01 VITALS — BP 138/102 | HR 69 | Resp 16 | Ht 69.0 in | Wt 216.0 lb

## 2014-12-01 DIAGNOSIS — G4489 Other headache syndrome: Secondary | ICD-10-CM

## 2014-12-01 DIAGNOSIS — Z720 Tobacco use: Secondary | ICD-10-CM

## 2014-12-01 DIAGNOSIS — G4440 Drug-induced headache, not elsewhere classified, not intractable: Secondary | ICD-10-CM

## 2014-12-01 DIAGNOSIS — R42 Dizziness and giddiness: Secondary | ICD-10-CM

## 2014-12-01 DIAGNOSIS — G4441 Drug-induced headache, not elsewhere classified, intractable: Secondary | ICD-10-CM

## 2014-12-01 MED ORDER — NORTRIPTYLINE HCL 25 MG PO CAPS: 25.0000 mg | ORAL_CAPSULE | Freq: Every day | ORAL | Status: DC

## 2014-12-01 NOTE — Progress Notes (Signed)
NEUROLOGY CONSULTATION NOTE  Alexander Hendricks MRN: [id number] DOB: 28-Jun-1966  Referring provider: ED Primary care provider: none  Reason for consult:  headache  HISTORY OF PRESENT ILLNESS: Alexander Hendricks is a 49 year old right-handed man with history of gastric ulcer and current smoker who presents for headache.  Records from ED, labs and prior CT of head reviewed.  Onset:  Around 2014 Location:  Back of head bilaterally Quality:  squeezing Intensity:  10/10 Aura:  no Prodrome:  no Associated symptoms:  No nausea or phonophobia.  He has some photosensitivity and blurred vision. Duration:  Several hours Frequency:  daily Triggers/exacerbating factors:  Sunlight, stress Relieving factors:  sleep Activity:  Able to function  abortive therapy:  Excedrin, "any headache pill".  He takes something at least every other day. preventative therapy:  none  He also has episodes of lightheadedness and feeling that he will pass out when he gets upset.  He was told in the past that it was stress-induced.  Caffeine:  No Alcohol:  occasionally Smoker:  No  Diet:  okay Exercise:  no Depression/stress:  stress Sleep hygiene:  okay Family history of headache:  Mother (had brain tumor).  No family history of aneurysms.  He himself has no prior history of headache  He did have a CT of the head performed on 01/20/13, which was unremarkable. CBC and CMP from August were unremarkable, except for mildly elevated glucose level of 105.   PAST MEDICAL HISTORY: Past Medical History  Diagnosis Date  . Multiple gastric ulcers   . Frequent headaches     PAST SURGICAL HISTORY: No past surgical history on file.  MEDICATIONS: Current Outpatient Prescriptions on File Prior to Visit  Medication Sig Dispense Refill  . calcium carbonate (TUMS - DOSED IN MG ELEMENTAL CALCIUM) 500 MG chewable tablet Chew 1 tablet by mouth as needed.     . ranitidine (ZANTAC) 150 MG tablet Take 150 mg by mouth 2 (two)  times daily.     No current facility-administered medications on file prior to visit.    ALLERGIES: No Known Allergies  FAMILY HISTORY: Family History  Problem Relation Age of Onset  . Cancer Mother   . Rheum arthritis Mother   . Cancer Father   . Alzheimer's disease Maternal Grandfather     SOCIAL HISTORY: History   Social History  . Marital Status: Single    Spouse Name: N/A  . Number of Children: N/A  . Years of Education: N/A   Occupational History  . Not on file.   Social History Main Topics  . Smoking status: Current Every Day Smoker -- 0.50 packs/day    Types: Cigarettes  . Smokeless tobacco: Never Used  . Alcohol Use: 0.0 oz/week    0 Standard drinks or equivalent per week     Comment: occassional  . Drug Use: Not on file  . Sexual Activity: Not on file   Other Topics Concern  . Not on file   Social History Narrative    REVIEW OF SYSTEMS: Constitutional: No fevers, chills, or sweats, no generalized fatigue, change in appetite Eyes: No visual changes, double vision, eye pain Ear, nose and throat: No hearing loss, ear pain, nasal congestion, sore throat Cardiovascular: No chest pain, palpitations Respiratory:  No shortness of breath at rest or with exertion, wheezes GastrointestinaI: No nausea, vomiting, diarrhea, abdominal pain, fecal incontinence Genitourinary:  No dysuria, urinary retention or frequency Musculoskeletal:  No neck pain, back pain Integumentary: No rash, pruritus, skin  lesions Neurological: as above Psychiatric: No depression, insomnia, anxiety Endocrine: No palpitations, fatigue, diaphoresis, mood swings, change in appetite, change in weight, increased thirst Hematologic/Lymphatic:  No anemia, purpura, petechiae. Allergic/Immunologic: no itchy/runny eyes, nasal congestion, recent allergic reactions, rashes  PHYSICAL EXAM: Filed Vitals:   12/01/14 1236  BP: 138/102  Pulse: 69  Resp: 16   General: No acute distress Head:   Normocephalic/atraumatic Eyes:  fundi unremarkable, without vessel changes, exudates, hemorrhages or papilledema. Neck: supple, no paraspinal tenderness, full range of motion Back: No paraspinal tenderness Heart: regular rate and rhythm Lungs: Clear to auscultation bilaterally. Vascular: No carotid bruits. Neurological Exam: Mental status: alert and oriented to person, place, and time, recent and remote memory intact, fund of knowledge intact, attention and concentration intact, speech fluent and not dysarthric, language intact. Cranial nerves: CN I: not tested CN II: pupils equal, round and reactive to light, visual fields intact, fundi unremarkable, without vessel changes, exudates, hemorrhages or papilledema. CN III, IV, VI:  full range of motion, no nystagmus, no ptosis CN V: facial sensation intact CN VII: upper and lower face symmetric CN VIII: hearing intact CN IX, X: gag intact, uvula midline CN XI: sternocleidomastoid and trapezius muscles intact CN XII: tongue midline Bulk & Tone: normal, no fasciculations. Motor:  5/5 throughout Sensation:  Temperature and vibration intact Deep Tendon Reflexes:  2+ throughout, toes downgoing Finger to nose testing:  No dysmetria Heel to shin:  No dysmetria Gait:  Normal station and stride.  Able to turn and walk in tandem. Romberg negative.  IMPRESSION: Unspecified headache syndrome.  More likely tension-type headache versus migraine Medication overuse headache Lightheadedness Tobacco abuse  PLAN: 1.  Will start nortriptyline 25mg  at bedtime.  Advised to call in 4 weeks with update. 2.  Limit use of pain relievers to no more than 2 days out of the week 3.  MRI of brain with and without contrast 4.  Get eyes checked 5.  Re-establish care with PCP to evaluate presyncope 6.  Smoking cessation 7.  Follow up in 3 months.  Thank you for allowing me to take part in the care of this patient.  Metta Clines, DO

## 2014-12-01 NOTE — Patient Instructions (Signed)
I think you likely are having tension headaches. 1.  We will get an MRI of the brain with and without contrast to rule out other causes 2.  I will start you on nortriptyline 25mg .  It is an antidepressant used for chronic headaches.  Call in 4 weeks with update to see if you are having less headaches 3.  Limit use of pain relievers (Advil, Aleve, Excedrin, Tylenol) to no more than 2 days out of the week to prevent rebound headache 4.  Start routine exercise 5.  Stay hydrated 6.  Follow up in 3 months.

## 2014-12-01 NOTE — Telephone Encounter (Signed)
Patient is aware of MRI at [facility] 12/16/14 at 3:45pm

## 2014-12-16 ENCOUNTER — Ambulatory Visit (HOSPITAL_COMMUNITY)
Admission: RE | Admit: 2014-12-16 | Discharge: 2014-12-16 | Disposition: A | Discharge status: Home / Self Care | Payer: 59 | Source: Ambulatory Visit | Attending: Neurology | Admitting: Neurology

## 2014-12-16 DIAGNOSIS — G4489 Other headache syndrome: Secondary | ICD-10-CM

## 2014-12-16 MED ORDER — GADOBENATE DIMEGLUMINE 529 MG/ML IV SOLN: 20.0000 mL | Freq: Once | INTRAVENOUS | Status: AC | PRN

## 2014-12-25 DIAGNOSIS — F1010 Alcohol abuse, uncomplicated: Secondary | ICD-10-CM | POA: Insufficient documentation

## 2014-12-25 DIAGNOSIS — K219 Gastro-esophageal reflux disease without esophagitis: Secondary | ICD-10-CM | POA: Insufficient documentation

## 2014-12-25 DIAGNOSIS — Z720 Tobacco use: Secondary | ICD-10-CM | POA: Insufficient documentation

## 2014-12-25 DIAGNOSIS — E7800 Pure hypercholesterolemia, unspecified: Secondary | ICD-10-CM | POA: Insufficient documentation

## 2014-12-25 DIAGNOSIS — Z8711 Personal history of peptic ulcer disease: Secondary | ICD-10-CM | POA: Insufficient documentation

## 2014-12-25 DIAGNOSIS — Z8719 Personal history of other diseases of the digestive system: Secondary | ICD-10-CM | POA: Insufficient documentation

## 2014-12-28 DIAGNOSIS — M170 Bilateral primary osteoarthritis of knee: Secondary | ICD-10-CM | POA: Insufficient documentation

## 2015-01-07 ENCOUNTER — Ambulatory Visit (HOSPITAL_COMMUNITY): Admission: RE | Admit: 2015-01-07 | Payer: 59 | Source: Ambulatory Visit

## 2015-01-08 DIAGNOSIS — E781 Pure hyperglyceridemia: Secondary | ICD-10-CM | POA: Insufficient documentation

## 2015-02-22 DIAGNOSIS — N401 Enlarged prostate with lower urinary tract symptoms: Secondary | ICD-10-CM

## 2015-02-22 DIAGNOSIS — N138 Other obstructive and reflux uropathy: Secondary | ICD-10-CM | POA: Insufficient documentation

## 2015-03-15 ENCOUNTER — Ambulatory Visit: Payer: 59 | Admitting: Neurology

## 2015-07-03 ENCOUNTER — Emergency Department (HOSPITAL_BASED_OUTPATIENT_CLINIC_OR_DEPARTMENT_OTHER)
Admission: EM | Admit: 2015-07-03 | Discharge: 2015-07-03 | Disposition: A | Discharge status: Home / Self Care | Payer: 59 | Attending: Emergency Medicine | Admitting: Emergency Medicine

## 2015-07-03 ENCOUNTER — Encounter (HOSPITAL_BASED_OUTPATIENT_CLINIC_OR_DEPARTMENT_OTHER): Payer: Self-pay | Admitting: *Deleted

## 2015-07-03 DIAGNOSIS — Z8719 Personal history of other diseases of the digestive system: Secondary | ICD-10-CM | POA: Diagnosis not present

## 2015-07-03 DIAGNOSIS — Z79899 Other long term (current) drug therapy: Secondary | ICD-10-CM | POA: Diagnosis not present

## 2015-07-03 DIAGNOSIS — Z720 Tobacco use: Secondary | ICD-10-CM | POA: Insufficient documentation

## 2015-07-03 DIAGNOSIS — M5412 Radiculopathy, cervical region: Secondary | ICD-10-CM | POA: Diagnosis not present

## 2015-07-03 DIAGNOSIS — R202 Paresthesia of skin: Secondary | ICD-10-CM | POA: Diagnosis not present

## 2015-07-03 DIAGNOSIS — M542 Cervicalgia: Secondary | ICD-10-CM | POA: Diagnosis present

## 2015-07-03 MED ORDER — DIAZEPAM 5 MG PO TABS: 5.0000 mg | ORAL_TABLET | Freq: Four times a day (QID) | ORAL | Status: DC | PRN

## 2015-07-03 MED ORDER — KETOROLAC TROMETHAMINE 60 MG/2ML IM SOLN
30.0000 mg | Freq: Once | INTRAMUSCULAR | Status: AC
  Administered 2015-07-03: 30 mg via INTRAMUSCULAR
  Filled 2015-07-03: qty 2

## 2015-07-03 NOTE — Discharge Instructions (Signed)

## 2015-07-03 NOTE — ED Notes (Signed)
MD at bedside. 

## 2015-07-03 NOTE — ED Notes (Signed)
Presents with neck pain, onset 3 weeks ago, left side area, pain progressively worse over the past three weeks, pain radiates down left arm

## 2015-07-03 NOTE — ED Provider Notes (Signed)
CSN: [id number]     Arrival date & time 07/03/15  0947 History   First MD Initiated Contact with Patient 07/03/15 [phone]     Chief Complaint  Patient presents with  . Neck Pain     (Consider location/radiation/quality/duration/timing/severity/associated sxs/prior Treatment) Patient is a 49 y.o. male presenting with neck pain.  Neck Pain Pain location:  Generalized neck Quality:  Shooting Radiates to: L arm. Pain severity:  Severe Pain is:  Same all the time Onset quality:  Gradual Duration:  3 weeks Timing:  Constant Progression:  Worsening Chronicity:  New Context: not fall and not lifting a heavy object   Relieved by:  Nothing Worsened by:  Position and twisting Associated symptoms: tingling   Associated symptoms: no bladder incontinence, no bowel incontinence, no chest pain, no fever, no headaches and no numbness     Past Medical History  Diagnosis Date  . Multiple gastric ulcers   . Frequent headaches    History reviewed. No pertinent past surgical history. Family History  Problem Relation Age of Onset  . Cancer Mother   . Rheum arthritis Mother   . Cancer Father   . Alzheimer's disease Maternal Grandfather    Social History  Substance Use Topics  . Smoking status: Current Every Day Smoker -- 0.50 packs/day    Types: Cigarettes  . Smokeless tobacco: Never Used  . Alcohol Use: 0.0 oz/week    0 Standard drinks or equivalent per week     Comment: occassional    Review of Systems  Constitutional: Negative for fever.  Cardiovascular: Negative for chest pain.  Gastrointestinal: Negative for bowel incontinence.  Genitourinary: Negative for bladder incontinence.  Musculoskeletal: Positive for neck pain.  Neurological: Positive for tingling. Negative for numbness and headaches.  All other systems reviewed and are negative.     Allergies  Review of patient's allergies indicates no known allergies.  Home Medications   Prior to Admission medications    Medication Sig Start Date End Date Taking? Authorizing Provider  calcium carbonate (TUMS - DOSED IN MG ELEMENTAL CALCIUM) 500 MG chewable tablet Chew 1 tablet by mouth as needed.     Historical Provider, MD  diazepam (VALIUM) 5 MG tablet Take 1 tablet (5 mg total) by mouth every 6 (six) hours as needed (spasms). 07/03/15   Debby Freiberg, MD  nortriptyline (PAMELOR) 25 MG capsule Take 1 capsule (25 mg total) by mouth at bedtime. 12/01/14   Pieter Partridge, DO  ranitidine (ZANTAC) 150 MG tablet Take 150 mg by mouth 2 (two) times daily.    Historical Provider, MD   BP 137/93 mmHg  Pulse 63  Temp(Src) 98.1 F (36.7 C) (Oral)  Resp 14  Ht 5\' 9"  (1.753 m)  Wt 195 lb (88.451 kg)  BMI 28.78 kg/m2  SpO2 100% Physical Exam  Constitutional: He is oriented to person, place, and time. He appears well-developed and well-nourished.  HENT:  Head: Normocephalic and atraumatic.  Eyes: Conjunctivae and EOM are normal.  Neck: Normal range of motion. Neck supple.  Cardiovascular: Normal rate, regular rhythm and normal heart sounds.   Pulmonary/Chest: Effort normal and breath sounds normal. No respiratory distress.  Abdominal: He exhibits no distension. There is no tenderness. There is no rebound and no guarding.  Musculoskeletal: Normal range of motion.       Cervical back: He exhibits tenderness. He exhibits no bony tenderness.  Neurological: He is alert and oriented to person, place, and time. He has normal strength. No sensory deficit.  Skin: Skin is warm and dry.  Vitals reviewed.   ED Course  Procedures (including critical care time) Labs Review Labs Reviewed - No data to display  Imaging Review No results found. I have personally reviewed and evaluated these images and lab results as part of my medical decision-making.   EKG Interpretation None      MDM   Final diagnoses:  Cervical radicular pain    49 y.o. male with pertinent PMH of PUD presents with L paraspinal neck pain,  consistent with cervical radiculopathy.  No concerning historical elements for cord pathology.  Physical exam with reproduction of symptoms with palpation along trapezius.  DC home in stable condition.    I have reviewed all laboratory and imaging studies if ordered as above  1. Cervical radicular pain         Debby Freiberg, MD 07/03/15 [phone]

## 2015-11-14 ENCOUNTER — Emergency Department (HOSPITAL_BASED_OUTPATIENT_CLINIC_OR_DEPARTMENT_OTHER)
Admission: EM | Admit: 2015-11-14 | Discharge: 2015-11-14 | Disposition: A | Discharge status: Home / Self Care | Payer: 59 | Attending: Emergency Medicine | Admitting: Emergency Medicine

## 2015-11-14 ENCOUNTER — Encounter (HOSPITAL_BASED_OUTPATIENT_CLINIC_OR_DEPARTMENT_OTHER): Payer: Self-pay | Admitting: Emergency Medicine

## 2015-11-14 ENCOUNTER — Emergency Department (HOSPITAL_BASED_OUTPATIENT_CLINIC_OR_DEPARTMENT_OTHER): Payer: 59

## 2015-11-14 DIAGNOSIS — Z8719 Personal history of other diseases of the digestive system: Secondary | ICD-10-CM | POA: Diagnosis not present

## 2015-11-14 DIAGNOSIS — I159 Secondary hypertension, unspecified: Secondary | ICD-10-CM | POA: Diagnosis not present

## 2015-11-14 DIAGNOSIS — F17210 Nicotine dependence, cigarettes, uncomplicated: Secondary | ICD-10-CM | POA: Insufficient documentation

## 2015-11-14 DIAGNOSIS — R42 Dizziness and giddiness: Secondary | ICD-10-CM | POA: Diagnosis not present

## 2015-11-14 DIAGNOSIS — Z79899 Other long term (current) drug therapy: Secondary | ICD-10-CM | POA: Insufficient documentation

## 2015-11-14 DIAGNOSIS — R079 Chest pain, unspecified: Secondary | ICD-10-CM | POA: Insufficient documentation

## 2015-11-14 LAB — CBC
HCT: 42.7 % (ref 39.0–52.0)
Hemoglobin: 14.1 g/dL (ref 13.0–17.0)
MCH: 29 pg (ref 26.0–34.0)
MCHC: 33 g/dL (ref 30.0–36.0)
MCV: 87.9 fL (ref 78.0–100.0)
Platelets: 206 10*3/uL (ref 150–400)
RBC: 4.86 MIL/uL (ref 4.22–5.81)
RDW: 14.2 % (ref 11.5–15.5)
WBC: 8.2 10*3/uL (ref 4.0–10.5)

## 2015-11-14 LAB — BASIC METABOLIC PANEL
ANION GAP: 7 (ref 5–15)
BUN: 12 mg/dL (ref 6–20)
CALCIUM: 9.1 mg/dL (ref 8.9–10.3)
CO2: 27 mmol/L (ref 22–32)
Chloride: 107 mmol/L (ref 101–111)
Creatinine, Ser: 1.08 mg/dL (ref 0.61–1.24)
GFR calc Af Amer: >60 mL/min (ref >60)
Glucose, Bld: 95 mg/dL (ref 65–99)
Potassium: 3.8 mmol/L (ref 3.5–5.1)
Sodium: 141 mmol/L (ref 135–145)

## 2015-11-14 LAB — TROPONIN I

## 2015-11-14 MED ORDER — ENALAPRILAT 1.25 MG/ML IV SOLN
1.2500 mg | Freq: Once | INTRAVENOUS | Status: AC
  Administered 2015-11-14: 1.25 mg via INTRAVENOUS
  Filled 2015-11-14: qty 2

## 2015-11-14 MED ORDER — LISINOPRIL 20 MG PO TABS: 20.0000 mg | ORAL_TABLET | Freq: Every day | ORAL | Status: DC

## 2015-11-14 MED ORDER — ASPIRIN 81 MG PO CHEW
324.0000 mg | CHEWABLE_TABLET | Freq: Once | ORAL | Status: AC
  Administered 2015-11-14: 324 mg via ORAL
  Filled 2015-11-14: qty 4

## 2015-11-14 NOTE — ED Notes (Signed)
States has had chest pain for the past few days, constant, describes as tightness

## 2015-11-14 NOTE — ED Notes (Signed)
MD at bedside. 

## 2015-11-14 NOTE — ED Notes (Signed)
Patient transported to X-ray via stretcher, sr x 2 up, off cardiac monitor per EDP orders

## 2015-11-14 NOTE — ED Provider Notes (Addendum)
CSN: [id number]     Arrival date & time 11/14/15  0829 History   First MD Initiated Contact with Patient 11/14/15 [phone]     Chief Complaint  Patient presents with  . Chest Pain      HPI  Patient presents for evaluation of chest tightness for the last 4 days.  Shows an area just to the left of his midline lower chest. States it feels tight. States been constant for the last 4 days. It is not pleuritic. Has not had any shortness of breath. He does not exercise on a regular basis. He cannot describe any exertional symptoms or change with time of day activity or other doctors. No nausea. No vomiting. No cough. No fever. States for the last several mornings when he stands on the morning he feels dizzy. This described as lightheadedness for a few seconds.  Sees been told "went into here or to the other doctor" that he has high blood pressure. States she's never been treated for it. At one point told he had a high blood sugar. But has never been treated for diabetes. He is a daily smoker.  History: Has a grandmother with pacemaker. Both his parents died of cancer, but no history of heart disease. Has a sister that does not have heart disease.      Past Medical History  Diagnosis Date  . Multiple gastric ulcers   . Frequent headaches    History reviewed. No pertinent past surgical history. Family History  Problem Relation Age of Onset  . Cancer Mother   . Rheum arthritis Mother   . Cancer Father   . Alzheimer's disease Maternal Grandfather    Social History  Substance Use Topics  . Smoking status: Current Every Day Smoker -- 0.50 packs/day    Types: Cigarettes  . Smokeless tobacco: Never Used  . Alcohol Use: 0.0 oz/week    0 Standard drinks or equivalent per week     Comment: occassional    Review of Systems  Constitutional: Negative for fever, chills, diaphoresis, appetite change and fatigue.  HENT: Negative for mouth sores, sore throat and trouble swallowing.   Eyes: Negative  for visual disturbance.  Respiratory: Positive for chest tightness. Negative for cough, shortness of breath and wheezing.   Cardiovascular: Negative for chest pain.  Gastrointestinal: Negative for nausea, vomiting, abdominal pain, diarrhea and abdominal distention.  Endocrine: Negative for polydipsia, polyphagia and polyuria.  Genitourinary: Negative for dysuria, frequency and hematuria.  Musculoskeletal: Negative for gait problem.  Skin: Negative for color change, pallor and rash.  Neurological: Positive for dizziness. Negative for syncope, light-headedness and headaches.  Hematological: Does not bruise/bleed easily.  Psychiatric/Behavioral: Negative for behavioral problems and confusion.      Allergies  Review of patient's allergies indicates no known allergies.  Home Medications   Prior to Admission medications   Medication Sig Start Date End Date Taking? Authorizing Provider  busPIRone (BUSPAR) 10 MG tablet Take 10 mg by mouth 3 (three) times daily.   Yes Historical Provider, MD  calcium carbonate (TUMS - DOSED IN MG ELEMENTAL CALCIUM) 500 MG chewable tablet Chew 1 tablet by mouth as needed.    Yes Historical Provider, MD  finasteride (PROSCAR) 5 MG tablet Take 5 mg by mouth daily.   Yes Historical Provider, MD  ranitidine (ZANTAC) 150 MG tablet Take 150 mg by mouth 2 (two) times daily.   Yes Historical Provider, MD  diazepam (VALIUM) 5 MG tablet Take 1 tablet (5 mg total) by mouth every  6 (six) hours as needed (spasms). 07/03/15   Debby Freiberg, MD  lisinopril (PRINIVIL,ZESTRIL) 20 MG tablet Take 1 tablet (20 mg total) by mouth daily. 11/14/15   Tanna Furry, MD  nortriptyline (PAMELOR) 25 MG capsule Take 1 capsule (25 mg total) by mouth at bedtime. 12/01/14   Pieter Partridge, DO   BP 145/101 mmHg  Pulse 58  Temp(Src) 97.8 F (36.6 C) (Oral)  Resp 13  SpO2 100% Physical Exam  Constitutional: He is oriented to person, place, and time. He appears well-developed and well-nourished. No  distress.  HENT:  Head: Normocephalic.  Eyes: Conjunctivae are normal. Pupils are equal, round, and reactive to light. No scleral icterus.  Neck: Normal range of motion. Neck supple. No thyromegaly present.  Cardiovascular: Normal rate and regular rhythm.  Exam reveals no gallop and no friction rub.   No murmur heard. Pulmonary/Chest: Effort normal and breath sounds normal. No respiratory distress. He has no wheezes. He has no rales.  Abdominal: Soft. Bowel sounds are normal. He exhibits no distension. There is no tenderness. There is no rebound.  Musculoskeletal: Normal range of motion.  Neurological: He is alert and oriented to person, place, and time.  Skin: Skin is warm and dry. No rash noted.  Psychiatric: He has a normal mood and affect. His behavior is normal.    ED Course  Procedures (including critical care time) Labs Review Labs Reviewed  BASIC METABOLIC PANEL  CBC  TROPONIN I    Imaging Review Dg Chest 2 View  11/14/2015  CLINICAL DATA:  Pt with chest pain x 1 week, current every day smoker, no hx heart or lung disease C/o some dizziness, hx HTN (not on meds) EXAM: CHEST  2 VIEW COMPARISON:  None. FINDINGS: Normal mediastinum and cardiac silhouette. Normal pulmonary vasculature. No evidence of effusion, infiltrate, or pneumothorax. No acute bony abnormality. IMPRESSION: No acute cardiopulmonary process. Electronically Signed   By: Suzy Bouchard M.D.   On: 11/14/2015 09:23   I have personally reviewed and evaluated these images and lab results as part of my medical decision-making.   EKG Interpretation   Date/Time:  Sunday November 14 2015 08:39:02 EST Ventricular Rate:  59 PR Interval:  162 QRS Duration: 86 QT Interval:  407 QTC Calculation: 403 R Axis:   17 Text Interpretation:  Sinus rhythm Borderline T abnormalities, inferior  leads Baseline wander in lead(s) V5 Confirmed by Jeneen Rinks  MD, Leland ([id number])  on 11/14/2015 8:45:13 AM      MDM   Final diagnoses:   Chest pain, unspecified chest pain type  Secondary hypertension, unspecified    Heart score 3. EKG without changes. Chest x-ray and troponin pending. His given Vasotec for his blood pressure. Aspirin by mouth. Await lab results.  Troponin normal. EKG without acute changes. Pain is atypical. He is appropriate for outpatient treatment. With his risks, will refer him to cardiology. Baby aspirin daily. Lisinopril prescription. Return to ER if any worsening revolving symptoms.  Tanna Furry, MD 11/14/15 [place], MD 11/14/15 [phone]

## 2015-11-14 NOTE — ED Notes (Signed)
DC instructions and Rx reviewed with pt, discussed when the need may arise to return to the ED, also provided a phone list for pt to aid in obtaining a primary care MD to help with BP control and smoking cessation. Pt had questioned re: BP and eye / vision issues, pt teaching done as well, also encouraged pt to make an eye exam appointment. Opportunity for questions provided. Teach Back Method used

## 2015-11-14 NOTE — Discharge Instructions (Signed)
Hypertension Hypertension is another name for high blood pressure. High blood pressure forces your heart to work harder to pump blood. A blood pressure reading has two numbers, which includes a higher number over a lower number (example: 110/72). HOME CARE   Have your blood pressure rechecked by your doctor.  Only take medicine as told by your doctor. Follow the directions carefully. The medicine does not work as well if you skip doses. Skipping doses also puts you at risk for problems.  Do not smoke.  Monitor your blood pressure at home as told by your doctor. GET HELP IF:  You think you are having a reaction to the medicine you are taking.  You have repeat headaches or feel dizzy.  You have puffiness (swelling) in your ankles.  You have trouble with your vision. GET HELP RIGHT AWAY IF:   You get a very bad headache and are confused.  You feel weak, numb, or faint.  You get chest or belly (abdominal) pain.  You throw up (vomit).  You cannot breathe very well. MAKE SURE YOU:   Understand these instructions.  Will watch your condition.  Will get help right away if you are not doing well or get worse.   This information is not intended to replace advice given to you by your health care provider. Make sure you discuss any questions you have with your health care provider.   Document Released: 03/13/2008 Document Revised: 09/30/2013 Document Reviewed: 07/18/2013 Elsevier Interactive Patient Education 2016 Elsevier [employer] Normal diagnoses 1 and Chest Wall Pain Chest wall pain is pain in or around the bones and muscles of your chest. Sometimes, an injury causes this pain. Sometimes, the cause may not be known. This pain may take several weeks or longer to get better. HOME CARE INSTRUCTIONS  Pay attention to any changes in your symptoms. Take these actions to help with your pain:   Rest as told by your health care provider.   Avoid activities that cause pain. These include  any activities that use your chest muscles or your abdominal and side muscles to lift heavy items.   If directed, apply ice to the painful area:  Put ice in a plastic bag.  Place a towel between your skin and the bag.  Leave the ice on for 20 minutes, 2-3 times per day.  Take over-the-counter and prescription medicines only as told by your health care provider.  Do not use tobacco products, including cigarettes, chewing tobacco, and e-cigarettes. If you need help quitting, ask your health care provider.  Keep all follow-up visits as told by your health care provider. This is important. SEEK MEDICAL CARE IF:  You have a fever.  Your chest pain becomes worse.  You have new symptoms. SEEK IMMEDIATE MEDICAL CARE IF:  You have nausea or vomiting.  You feel sweaty or light-headed.  You have a cough with phlegm (sputum) or you cough up blood.  You develop shortness of breath.   This information is not intended to replace advice given to you by your health care provider. Make sure you discuss any questions you have with your health care provider.   Document Released: 09/25/2005 Document Revised: 06/16/2015 Document Reviewed: 12/21/2014 Elsevier Interactive Patient Education [employer].

## 2015-11-14 NOTE — ED Notes (Signed)
ECG obtained and to EDP for evaluation

## 2015-11-14 NOTE — ED Notes (Signed)
Pt placed on cont cardiac monitoring with cont POX and q30 NBP monitoring

## 2015-11-14 NOTE — ED Notes (Signed)
Family at bedside. 

## 2015-12-27 ENCOUNTER — Telehealth: Payer: Self-pay | Admitting: General Practice

## 2015-12-27 NOTE — Telephone Encounter (Signed)
Called pt to complete pre-visit phone call. LMOVM to return call. Patient advised on message that if unable to return call to please arrive 10-15 minutes early for appointment to update all insurance and paperwork with front desk.

## 2015-12-28 ENCOUNTER — Ambulatory Visit (HOSPITAL_BASED_OUTPATIENT_CLINIC_OR_DEPARTMENT_OTHER)
Admission: RE | Admit: 2015-12-28 | Discharge: 2015-12-28 | Disposition: A | Discharge status: Home / Self Care | Payer: 59 | Source: Ambulatory Visit | Attending: Family | Admitting: Family

## 2015-12-28 ENCOUNTER — Ambulatory Visit (INDEPENDENT_AMBULATORY_CARE_PROVIDER_SITE_OTHER): Payer: 59 | Admitting: Family

## 2015-12-28 ENCOUNTER — Encounter (HOSPITAL_BASED_OUTPATIENT_CLINIC_OR_DEPARTMENT_OTHER): Payer: Self-pay

## 2015-12-28 ENCOUNTER — Encounter: Payer: Self-pay | Admitting: Family

## 2015-12-28 ENCOUNTER — Encounter: Payer: Self-pay | Admitting: Internal Medicine

## 2015-12-28 VITALS — BP 140/100 | HR 66 | Temp 98.1°F | Resp 16 | Ht 69.0 in | Wt 200.8 lb

## 2015-12-28 DIAGNOSIS — R634 Abnormal weight loss: Secondary | ICD-10-CM

## 2015-12-28 DIAGNOSIS — N289 Disorder of kidney and ureter, unspecified: Secondary | ICD-10-CM | POA: Insufficient documentation

## 2015-12-28 DIAGNOSIS — R1013 Epigastric pain: Secondary | ICD-10-CM

## 2015-12-28 DIAGNOSIS — F411 Generalized anxiety disorder: Secondary | ICD-10-CM

## 2015-12-28 DIAGNOSIS — R10816 Epigastric abdominal tenderness: Secondary | ICD-10-CM | POA: Diagnosis not present

## 2015-12-28 DIAGNOSIS — I10 Essential (primary) hypertension: Secondary | ICD-10-CM

## 2015-12-28 DIAGNOSIS — M47896 Other spondylosis, lumbar region: Secondary | ICD-10-CM | POA: Diagnosis not present

## 2015-12-28 DIAGNOSIS — N400 Enlarged prostate without lower urinary tract symptoms: Secondary | ICD-10-CM

## 2015-12-28 HISTORY — DX: Essential (primary) hypertension: I10

## 2015-12-28 LAB — CBC WITH DIFFERENTIAL/PLATELET
Basophils Absolute: 0 10*3/uL (ref 0.0–0.1)
Basophils Relative: 0.4 % (ref 0.0–3.0)
EOS PCT: 1.2 % (ref 0.0–5.0)
Eosinophils Absolute: 0.1 10*3/uL (ref 0.0–0.7)
HCT: 41.9 % (ref 39.0–52.0)
HEMOGLOBIN: 14.1 g/dL (ref 13.0–17.0)
Lymphocytes Relative: 19.1 % (ref 12.0–46.0)
Lymphs Abs: 1.7 10*3/uL (ref 0.7–4.0)
MCHC: 33.8 g/dL (ref 30.0–36.0)
MCV: 88 fl (ref 78.0–100.0)
MONO ABS: 0.3 10*3/uL (ref 0.1–1.0)
Monocytes Relative: 3.3 % (ref 3.0–12.0)
Neutro Abs: 6.7 10*3/uL (ref 1.4–7.7)
Neutrophils Relative %: 76 % (ref 43.0–77.0)
Platelets: 215 10*3/uL (ref 150.0–400.0)
RBC: 4.76 Mil/uL (ref 4.22–5.81)
RDW: 14.3 % (ref 11.5–15.5)
WBC: 8.8 10*3/uL (ref 4.0–10.5)

## 2015-12-28 LAB — BASIC METABOLIC PANEL
BUN: 12 mg/dL (ref 6–23)
CHLORIDE: 105 meq/L (ref 96–112)
CO2: 30 mEq/L (ref 19–32)
Calcium: 9.4 mg/dL (ref 8.4–10.5)
Creatinine, Ser: 1.1 mg/dL (ref 0.40–1.50)
GFR: 91.37 mL/min (ref >60.00)
GLUCOSE: 90 mg/dL (ref 70–99)
POTASSIUM: 4.1 meq/L (ref 3.5–5.1)
Sodium: 140 mEq/L (ref 135–145)

## 2015-12-28 LAB — H. PYLORI ANTIBODY, IGG: H Pylori IgG: POSITIVE — AB

## 2015-12-28 LAB — HEPATIC FUNCTION PANEL
ALBUMIN: 4.3 g/dL (ref 3.5–5.2)
ALT: 25 U/L (ref 0–53)
AST: 18 U/L (ref 0–37)
Alkaline Phosphatase: 60 U/L (ref 39–117)
BILIRUBIN TOTAL: 0.9 mg/dL (ref 0.2–1.2)
Bilirubin, Direct: 0.2 mg/dL (ref 0.0–0.3)
Total Protein: 7.3 g/dL (ref 6.0–8.3)

## 2015-12-28 LAB — AMYLASE: Amylase: 41 U/L (ref 27–131)

## 2015-12-28 LAB — LIPASE: Lipase: 19 U/L (ref 11.0–59.0)

## 2015-12-28 LAB — TSH: TSH: 1.13 u[IU]/mL (ref 0.35–4.50)

## 2015-12-28 MED ORDER — IOHEXOL 300 MG/ML  SOLN
100.0000 mL | Freq: Once | INTRAMUSCULAR | Status: AC | PRN
  Administered 2015-12-28: 100 mL via INTRAVENOUS

## 2015-12-28 MED ORDER — LISINOPRIL 20 MG PO TABS: 20.0000 mg | ORAL_TABLET | Freq: Every day | ORAL | Status: DC

## 2015-12-28 MED ORDER — OMEPRAZOLE 40 MG PO CPDR: 40.0000 mg | DELAYED_RELEASE_CAPSULE | Freq: Every day | ORAL | Status: DC

## 2015-12-28 NOTE — Assessment & Plan Note (Signed)
Seems stable, ok to remain off of buspar, monito.

## 2015-12-28 NOTE — Progress Notes (Signed)
Subjective:    Patient ID: Alexander Hendricks, male    DOB: 02/14/1966, 50 y.o.   MRN: [id number]  HPI   Alexander Hendricks is a 50 yr old male who presents today to establish care.    Pmhx is significant for the following:  Gastric Ulcers- reports that was diagnosed 3 yrs ago in the ED. Pt reports that he was treated with ranitidine and symptoms improved.  He denies black/bloody stools.  Reports mild GI discomfort, like I am hungry.  Food improves the discomfort.  Denies NSAID use.  Denies N/V.  Reports + dizziness in the AM's and if he gets "hot."  Feels dizzy after exercise. Excitement or altercation makes him dizzy.    Frequent Headaches- reports that he has not had any recent HA's.   Anxiety-  Notes that he sometimes is nervous at night with driving.  Not taking buspirone regularly.  Reports mood is good.  Denies regular panic attzcks.    GERD- currently on ranitidine.  Has been on for "years"  BPH- reports that he was having nocturia and poor stream.  Was placed on proscar.  He had elvated PSA and is followed by Dr. Naaman Plummer. Had a prostate biopsy- per patient   Unexplained weight loss- Patient reports that he used to weigh 235-240.  Reports 2 weeks ago he weight 210.  Reports anorexia.  + early satiety.   Wt Readings from Last 3 Encounters:  12/28/15 200 lb 12.8 oz (91.082 kg)  07/03/15 195 lb (88.451 kg)  12/01/14 216 lb (97.977 kg)   HTN- ran out of lisinopril.    Review of Systems  Constitutional: Negative for unexpected weight change.  HENT: Negative for hearing loss and rhinorrhea.   Eyes:       Feels like he needs reading glasses  Respiratory: Negative for cough.   Cardiovascular: Negative for leg swelling.  Gastrointestinal: Negative for nausea, vomiting, diarrhea, constipation and blood in stool.  Genitourinary: Negative for dysuria.  Musculoskeletal:       Chronic bilateral knee pain  Skin: Negative for rash.  Neurological: Negative for headaches.  Hematological:  Negative for adenopathy.  Psychiatric/Behavioral:       Denies depression   Past Medical History  Diagnosis Date  . Multiple gastric ulcers   . Frequent headaches     Social History   Social History  . Marital Status: Single    Spouse Name: N/A  . Number of Children: N/A  . Years of Education: N/A   Occupational History  . Not on file.   Social History Main Topics  . Smoking status: Current Every Day Smoker -- 1.00 packs/day for 35 years    Types: Cigarettes  . Smokeless tobacco: Never Used  . Alcohol Use: 0.0 oz/week    0 Standard drinks or equivalent per week     Comment: occassional  . Drug Use: 3.00 per week    Special: Marijuana  . Sexual Activity: Not on file   Other Topics Concern  . Not on file   Social History Narrative   Fiance   24 yr old son lives with pt (from previous marriage)   2 sons and 1 daughter- grown and out of the house (all here in [place])  Has 4 grandchildren   Works as a Glass blower/designer.   Completed graduate school   Enjoys watching son's sports   No pets       History reviewed. No pertinent past surgical history.  Family History  Problem  Relation Age of Onset  . Cancer Mother     colon  . Rheum arthritis Mother   . Cancer Father     leukemia  . Alzheimer's disease Maternal Grandfather     No Known Allergies  Current Outpatient Prescriptions on File Prior to Visit  Medication Sig Dispense Refill  . busPIRone (BUSPAR) 10 MG tablet Take 10 mg by mouth 3 (three) times daily.    . finasteride (PROSCAR) 5 MG tablet Take 5 mg by mouth daily.    . ranitidine (ZANTAC) 150 MG tablet Take 150 mg by mouth daily.     Marland Kitchen lisinopril (PRINIVIL,ZESTRIL) 20 MG tablet Take 1 tablet (20 mg total) by mouth daily. (Patient not taking: Reported on 12/28/2015) 30 tablet 0   No current facility-administered medications on file prior to visit.    BP 140/100 mmHg  Pulse 66  Temp(Src) 98.1 F (36.7 C) (Oral)  Resp 16  Ht 5\' 9"  (1.753 m)  Wt 200  lb 12.8 oz (91.082 kg)  BMI 29.64 kg/m2  SpO2 97%       Objective:   Physical Exam  Constitutional: He is oriented to person, place, and time. He appears well-developed and well-nourished. No distress.  HENT:  Head: Normocephalic and atraumatic.  Right Ear: Tympanic membrane and ear canal normal.  Left Ear: Tympanic membrane and ear canal normal.  Mouth/Throat: No oropharyngeal exudate, posterior oropharyngeal edema or posterior oropharyngeal erythema.  Cardiovascular: Normal rate and regular rhythm.   No murmur heard. Pulmonary/Chest: Effort normal and breath sounds normal. No respiratory distress. He has no wheezes. He has no rales.  Abdominal: Soft. He exhibits no distension and no mass. There is no rebound and no guarding.  Mild epigastric tenderness  Musculoskeletal: He exhibits no edema.  Lymphadenopathy:    He has no cervical adenopathy.  Neurological: He is alert and oriented to person, place, and time.  Skin: Skin is warm and dry.  Psychiatric: He has a normal mood and affect. His behavior is normal. Thought content normal.          Assessment & Plan:  Unexplained weight loss- ? Related to GI  Issue, need to rule out underlying malignancy. Will also obtain TSH to further evaluate.

## 2015-12-28 NOTE — Assessment & Plan Note (Signed)
Uncontrolled.  Restart lisinopril.

## 2015-12-28 NOTE — Progress Notes (Signed)
Pre visit review using our clinic review tool, if applicable. No additional management support is needed unless otherwise documented below in the visit note. 

## 2015-12-28 NOTE — Assessment & Plan Note (Signed)
Reports good voiding on proscar. Followed by urology- reports neg biopsy  (Dr. Heather Roberts)

## 2015-12-28 NOTE — Assessment & Plan Note (Signed)
Differential includes:  Pancreatitis, gastric ulcer,  Cholecystitis,  Underlying malignancy.  Will obtain CBC to rule out anemia,  CMET/Lipase/Amaylase to evaluate gallbladder, CT abd/pelvis due to weight loss to help rule out  Underlying malignancy,  Refer to GI for consult- will likely need endo/colo.  D/c zantac, start omeprazole.  Obtain H Pylori.  He reports that he did have a reaction to an antibiotic but does not know name, reports that he has rx at home. I have asked him to call us and give [place] the name of the med he had reaction to.

## 2015-12-28 NOTE — Patient Instructions (Signed)
Please complete lab work prior to leaving. You will be contacted about your referral to GI and about your CT scan. Go to the ER if you develop severe/worsening abdominal pain or if you are unable to keep down food/drink. Restart lisinopril for blood pressure. Follow up in 2-3 weeks.

## 2015-12-29 ENCOUNTER — Telehealth: Payer: Self-pay | Admitting: Family

## 2015-12-29 DIAGNOSIS — N289 Disorder of kidney and ureter, unspecified: Secondary | ICD-10-CM

## 2015-12-29 NOTE — Telephone Encounter (Signed)
Left Rx on pharmacy voicemail, notified pt.  He states that oral medication did not work for him the last time he had an MRI. States "he tried to complete test 3 times and they finally gave me a medication via IV to sedate me". I asked pt if he was confusing MRI contrast with IV sedation and he states he is not. States he had his previous MRI at [facility] and he was not an inpatient at that time.  Please advise?

## 2015-12-29 NOTE — Telephone Encounter (Signed)
OK to send in ativan 1 mg tab, 1 tab 30 min prior to procedure. Will need someone to drive him home.

## 2015-12-29 NOTE — Telephone Encounter (Signed)
Please let pt know that CT shows some colon inflammation.  I am working to get him in with GI in the next week or so. They will likely recommend colonoscopy.  Lab work looks good but his H pylori is +, this may be due to previous H pylori infection.  I am going to leave it up to GI if they wish to repeat antibiotics.

## 2015-12-29 NOTE — Telephone Encounter (Signed)
Could you please contact [facility] and see if they are able to provide IV ativan with MRI and if they have open MRI? Thanks.

## 2015-12-29 NOTE — Telephone Encounter (Signed)
Also, there is a spot on his left kidney which the radiologist is recommending MRI to further evaluate.  I will place MRI order.

## 2015-12-29 NOTE — Telephone Encounter (Signed)
Notified pt and he voices understanding.  He states that he is claustrophobic and will need medication prior to his MRI.  Please advise?

## 2015-12-30 NOTE — Telephone Encounter (Signed)
Spoke with Alexander Hendricks at [facility].  She says that they do not provide IV sedation.  She also says that they have a somewhat open MRI in that both ends are open, but the sides are closed.  She says weight limit for this MRI is 440lbs and therefore has a larger bore.    Please advise.

## 2015-12-30 NOTE — Telephone Encounter (Signed)
Alexander Hendricks, see below.  Please set up at [facility] imaging.

## 2015-12-31 NOTE — Telephone Encounter (Signed)
Order sent to [facility]/awaiting appt

## 2015-12-31 NOTE — Telephone Encounter (Signed)
Notified pt and he states that his previous MRI was at [facility]. I spoke with radiology at [facility] and they have something called monitored anesthesia but pt would have to have pre-op eval to determine if he would qualify for this. She then stated that he would not be eligible due to the type of MRI (abdomen) as pt would need to be awake to follow their commands to breathe in and out at different intervals. Left detailed message on pt's voicemail that we haven't found a facility that will do IV sedation and we will be proceeding with [facility] and oral ativan and to call with any questions.

## 2015-12-31 NOTE — Telephone Encounter (Signed)
Notified pt of below and he requests dose sufficient to get him through the MRI based on past experiences. Per verbal from PCP, ok to dispense Ativan 1mg , #2 tablets. Take 1 tablet 30 min prior to procedure and take the 2nd tablet with him and take only if he is unable to tolerate open MRI.Marland Kitchen Pt voices understanding. Attempted to call pharmacy to update quantity from previous Rx and was unable to speak with anyone. Will try again.

## 2016-01-03 ENCOUNTER — Telehealth: Payer: Self-pay | Admitting: *Deleted

## 2016-01-03 ENCOUNTER — Ambulatory Visit (INDEPENDENT_AMBULATORY_CARE_PROVIDER_SITE_OTHER): Payer: 59 | Admitting: Internal Medicine

## 2016-01-03 ENCOUNTER — Encounter: Payer: Self-pay | Admitting: Internal Medicine

## 2016-01-03 VITALS — BP 122/82 | HR 62 | Ht 69.0 in | Wt 200.5 lb

## 2016-01-03 DIAGNOSIS — Z800 Family history of malignant neoplasm of digestive organs: Secondary | ICD-10-CM | POA: Diagnosis not present

## 2016-01-03 DIAGNOSIS — R1084 Generalized abdominal pain: Secondary | ICD-10-CM

## 2016-01-03 DIAGNOSIS — R935 Abnormal findings on diagnostic imaging of other abdominal regions, including retroperitoneum: Secondary | ICD-10-CM | POA: Diagnosis not present

## 2016-01-03 DIAGNOSIS — R634 Abnormal weight loss: Secondary | ICD-10-CM

## 2016-01-03 MED ORDER — LORAZEPAM 1 MG PO TABS: ORAL_TABLET | ORAL | Status: DC

## 2016-01-03 MED ORDER — NA SULFATE-K SULFATE-MG SULF 17.5-3.13-1.6 GM/177ML PO SOLN: 1.0000 | Freq: Once | ORAL | Status: DC

## 2016-01-03 NOTE — Telephone Encounter (Signed)
Spoke with pharmacist and gave verbal to change ativan quantity to # 2 tabs.

## 2016-01-03 NOTE — Patient Instructions (Signed)

## 2016-01-03 NOTE — Progress Notes (Signed)
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 Patient reports that he was diagnosed with peptic ulcer in the emergency room several years ago. No imaging or endoscopy to confirm. He is uncertain about treatment. Then, has had decreased appetite and reports weight loss. Some abdominal discomfort which she describes as a hunger feeling. No difficulties with his bowel movements and no bleeding. Review of outside records including office note, laboratories, an imaging have been performed. Blood work from March 2017 was unremarkable including normal comprehensive metabolic panel, CBC with differential, and TSH. Contrast-enhanced CT scan of the abdomen and pelvis performed 12/28/2015 revealed soft tissue infiltration along the wall of the cecum and ascending colon. Etiology uncertain. Colonoscopy recommended. He was also noted to have a 7 mm indeterminate lesion in the left kidney for which MRI has already been scheduled in April. Patient has no other complaints. He did undergo abdominal ultrasound August 2015 which was unremarkable. He is currently on omeprazole 40 mg daily and ranitidine 150 mg at night. This for history of acid reflux. Seemingly helpful. No dysphagia.  REVIEW OF SYSTEMS:  All non-GI ROS negative except for sinus and allergy, anxiety, arthritis, visual change, fatigue, headaches  Past Medical History  Diagnosis Date  . Multiple gastric ulcers   . Frequent headaches   . HTN (hypertension) 12/28/2015  . Benign prostatic hyperplasia     History reviewed. No pertinent past surgical history.  Social History Alexander Hendricks  reports that he has been smoking Cigarettes.  He has a 35 pack-year smoking history. He has never used smokeless tobacco. He reports that he drinks alcohol. He reports that he uses illicit drugs (Marijuana)  about 3 times per week.  family history includes Alzheimer's disease in his maternal grandfather; Cancer in his father and mother; Rheum arthritis in his mother.  No Known Allergies     PHYSICAL EXAMINATION: Vital signs: BP 122/82 mmHg  Pulse 62  Ht 5\' 9"  (1.753 m)  Wt 200 lb 8 oz (90.946 kg)  BMI 29.60 kg/m2  Constitutional: generally well-appearing, no acute distress Psychiatric: alert and oriented x3, cooperative Eyes: extraocular movements intact, anicteric, conjunctiva pink Mouth: oral pharynx moist, no lesions Neck: supple no lymphadenopathy Cardiovascular: heart regular rate and rhythm, no murmur Lungs: clear to auscultation bilaterally Abdomen: soft, nontender, nondistended, no obvious ascites, no peritoneal signs, normal bowel sounds, no organomegaly Rectal:Deferred until colonoscopy Extremities: no clubbing cyanosis or lower extremity edema bilaterally Skin: no lesions on visible extremities Neuro: No focal deficits. Normal DTRs  ASSESSMENT:  #1. Abdominal discomfort as described #2. CT scan raising question of abnormal right colon. Rule out neoplasia #3. History of GERD. On acid suppressive therapy #4. Question history of ulcer disease #5. Mild weight loss  PLAN:  #1. Schedule colonoscopy.The nature of the procedure, as well as the risks, benefits, and alternatives were carefully and thoroughly reviewed with the patient. Ample time for discussion and questions allowed. The patient understood, was satisfied, and agreed to proceed. #2. If colonoscopy negative, schedule EGD #3. Keep MRI as already scheduled by PCP  A copy of this consultation note has been sent PCP Debbrah Alar

## 2016-01-03 NOTE — Telephone Encounter (Signed)
-----   Message from Synthia Innocent sent at 12/30/2015  8:51 AM EDT ----- Kermit Balo Morning! Per Imaging MRI abd with and without must be ordered together as a double study. Can order please be changed? Thanks

## 2016-01-03 NOTE — Telephone Encounter (Signed)
Done

## 2016-01-07 ENCOUNTER — Encounter: Payer: Self-pay | Admitting: *Deleted

## 2016-01-07 ENCOUNTER — Telehealth: Payer: Self-pay | Admitting: *Deleted

## 2016-01-07 NOTE — Telephone Encounter (Signed)
Patient states that the Suprep is too expensive for him.  Randall Hiss, RN is taking the call and changing patient to Miralax and will give him instructions.

## 2016-01-07 NOTE — Telephone Encounter (Signed)
No Suprep samples available, but Moviprep is. Pt will be here at 1230 to pick up and new instructions will be reviewed with him

## 2016-01-10 ENCOUNTER — Encounter: Payer: Self-pay | Admitting: Internal Medicine

## 2016-01-10 ENCOUNTER — Ambulatory Visit (AMBULATORY_SURGERY_CENTER): Payer: 59 | Admitting: Internal Medicine

## 2016-01-10 VITALS — BP 132/89 | HR 53 | Temp 99.1°F | Resp 14 | Ht 69.0 in | Wt 200.0 lb

## 2016-01-10 DIAGNOSIS — R935 Abnormal findings on diagnostic imaging of other abdominal regions, including retroperitoneum: Secondary | ICD-10-CM | POA: Diagnosis not present

## 2016-01-10 DIAGNOSIS — D123 Benign neoplasm of transverse colon: Secondary | ICD-10-CM

## 2016-01-10 DIAGNOSIS — D122 Benign neoplasm of ascending colon: Secondary | ICD-10-CM

## 2016-01-10 MED ORDER — SODIUM CHLORIDE 0.9 % IV SOLN: 500.0000 mL | INTRAVENOUS | Status: DC

## 2016-01-10 NOTE — Op Note (Signed)
[name] Patient Name: Alexander Hendricks Procedure Date: 01/10/2016 10:31 AM MRN: [id number] Endoscopist: Docia Chuck. Henrene Pastor , MD Age: 50 Referring MD:  Date of Birth: 30-Oct-1965 Gender: Male Procedure:                Colonoscopy with snare polypectomy -3 Indications:              Generalized abdominal pain, Abnormal CT of the GI                            tract(thickening or inflammatory change of the                            right colon and cecum questioned) Medicines:                Monitored Anesthesia Care Procedure:                Pre-Anesthesia Assessment:                           - Prior to the procedure, a History and Physical                            was performed, and patient medications and                            allergies were reviewed. The patient's tolerance of                            previous anesthesia was also reviewed. The risks                            and benefits of the procedure and the sedation                            options and risks were discussed with the patient.                            All questions were answered, and informed consent                            was obtained. Prior Anticoagulants: The patient has                            taken no previous anticoagulant or antiplatelet                            agents. ASA Grade Assessment: I - A normal, healthy                            patient. After reviewing the risks and benefits,                            the patient was deemed in satisfactory condition to  undergo the procedure.                           After obtaining informed consent, the colonoscope                            was passed under direct vision. Throughout the                            procedure, the patient's blood pressure, pulse, and                            oxygen saturations were monitored continuously. The                            Model CF-HQ190L [id number]) scope was  introduced                            through the anus and advanced to the the cecum,                            identified by appendiceal orifice and ileocecal                            valve. The quality of the bowel preparation was                            excellent. The bowel preparation used was SUPREP.                            The ileocecal valve, appendiceal orifice, and                            rectum were photographed. Scope In: 10:44:01 AM Scope Out: 11:00:59 AM Scope Withdrawal Time: 0 hours 13 minutes 21 seconds  Total Procedure Duration: 0 hours 16 minutes 58 seconds  Findings:      The perianal and digital rectal examinations were normal.      Three polyps were found in the hepatic flexure and ascending colon. The       polyps were 2 to 6 mm in size. These polyps were removed with a cold       snare. Resection and retrieval were complete.      Multiple small-mouthed diverticula were found in the sigmoid colon.      The exam was otherwise without abnormality on direct and retroflexion       views. Complications:            No immediate complications. Estimated Blood Loss:     Estimated blood loss: none. Impression:               - Three 2 to 6 mm polyps at the hepatic flexure and                            in the ascending colon, removed with a cold snare.  Resected and retrieved.                           - Diverticulosis in the sigmoid colon.                           - The examination was otherwise normal on direct                            and retroflexion views. Recommendation:           - Patient has a contact number available for                            emergencies. The signs and symptoms of potential                            delayed complications were discussed with the                            patient. Return to normal activities tomorrow.                            Written discharge instructions were provided to the                             patient.                           - Resume previous diet.                           - Continue present medications.                           - Await pathology results.                           - Repeat colonoscopy in 3 - 5 years for                            surveillance (pending pathology).                           - Perform an upper GI endoscopy at the next                            available appointment. Please schedule in [place]                            "abdominal discomfort". Procedure Code(s):        --- Professional ---                           [id number], Colonoscopy, flexible; with removal of  tumor(s), polyp(s), or other lesion(s) by snare                            technique CPT copyright 2016 American Medical Association. All rights reserved. Docia Chuck. Henrene Pastor, MD 01/10/2016 11:11:13 AM This report has been signed electronically. Number of Addenda: 0 CC Letter to:             Debbrah Alar Referring MD:      Debbrah Alar

## 2016-01-10 NOTE — Progress Notes (Signed)
To recovery, report to Myers, RN, VSS. 

## 2016-01-10 NOTE — Patient Instructions (Signed)
Colon polyps removed today. Diverticulosis seen. Handouts given on polyps,diverticulosis. Result letter in your mail in 2-3 weeks. Upper ENDO. 01-17-16, instructions given. Please follow.   YOU HAD AN ENDOSCOPIC PROCEDURE TODAY AT [facility]:   Refer to the procedure report that was given to you for any specific questions about what was found during the examination.  If the procedure report does not answer your questions, please call your gastroenterologist to clarify.  If you requested that your care partner not be given the details of your procedure findings, then the procedure report has been included in a sealed envelope for you to review at your convenience later.  YOU SHOULD EXPECT: Some feelings of bloating in the abdomen. Passage of more gas than usual.  Walking can help get rid of the air that was put into your GI tract during the procedure and reduce the bloating. If you had a lower endoscopy (such as a colonoscopy or flexible sigmoidoscopy) you may notice spotting of blood in your stool or on the toilet paper. If you underwent a bowel prep for your procedure, you may not have a normal bowel movement for a few days.  Please Note:  You might notice some irritation and congestion in your nose or some drainage.  This is from the oxygen used during your procedure.  There is no need for concern and it should clear up in a day or so.  SYMPTOMS TO REPORT IMMEDIATELY:   Following lower endoscopy (colonoscopy or flexible sigmoidoscopy):  Excessive amounts of blood in the stool  Significant tenderness or worsening of abdominal pains  Swelling of the abdomen that is new, acute  Fever of 100F or higher   Following upper endoscopy (EGD)  Vomiting of blood or coffee ground material  New chest pain or pain under the shoulder blades  Painful or persistently difficult swallowing  New shortness of breath  Fever of 100F or higher  Black, tarry-looking stools  For urgent or  emergent issues, a gastroenterologist can be reached at any hour by calling [phone].   DIET: Your first meal following the procedure should be a small meal and then it is ok to progress to your normal diet. Heavy or fried foods are harder to digest and may make you feel nauseous or bloated.  Likewise, meals heavy in dairy and vegetables can increase bloating.  Drink plenty of fluids but you should avoid alcoholic beverages for 24 hours.  ACTIVITY:  You should plan to take it easy for the rest of today and you should NOT DRIVE or use heavy machinery until tomorrow (because of the sedation medicines used during the test).    FOLLOW UP: Our staff will call the number listed on your records the next business day following your procedure to check on you and address any questions or concerns that you may have regarding the information given to you following your procedure. If we do not reach you, we will leave a message.  However, if you are feeling well and you are not experiencing any problems, there is no need to return our call.  We will assume that you have returned to your regular daily activities without incident.  If any biopsies were taken you will be contacted by phone or by letter within the next 1-3 weeks.  Please call us at [phone] if you have not heard about the biopsies in 3 weeks.    SIGNATURES/CONFIDENTIALITY: You and/or your care partner have signed paperwork which will  be entered into your electronic medical record.  These signatures attest to the fact that that the information above on your After Visit Summary has been reviewed and is understood.  Full responsibility of the confidentiality of this discharge information lies with you and/or your care-partner. 

## 2016-01-10 NOTE — Progress Notes (Signed)
Called to room to assist during endoscopic procedure.  Patient ID and intended procedure confirmed with present staff. Received instructions for my participation in the procedure from the performing physician.  

## 2016-01-11 ENCOUNTER — Ambulatory Visit
Admission: RE | Admit: 2016-01-11 | Discharge: 2016-01-11 | Disposition: A | Discharge status: Home / Self Care | Payer: 59 | Source: Ambulatory Visit | Attending: Family | Admitting: Family

## 2016-01-11 ENCOUNTER — Telehealth: Payer: Self-pay | Admitting: *Deleted

## 2016-01-11 DIAGNOSIS — N289 Disorder of kidney and ureter, unspecified: Secondary | ICD-10-CM

## 2016-01-11 MED ORDER — GADOBENATE DIMEGLUMINE 529 MG/ML IV SOLN
19.0000 mL | Freq: Once | INTRAVENOUS | Status: AC | PRN
  Administered 2016-01-11: 19 mL via INTRAVENOUS

## 2016-01-11 NOTE — Telephone Encounter (Signed)
  Follow up Call-  Call back number 01/10/2016  Post procedure Call Back phone  # [phone]  Permission to leave phone message Yes     Patient questions:  Do you have a fever, pain , or abdominal swelling? No. Pain Score  0 *  Have you tolerated food without any problems? Yes.    Have you been able to return to your normal activities? Yes.    Do you have any questions about your discharge instructions: Diet   No. Medications  No. Follow up visit  No.  Do you have questions or concerns about your Care? No.  Actions: * If pain score is 4 or above: No action needed, pain <4.

## 2016-01-12 ENCOUNTER — Encounter: Payer: Self-pay | Admitting: Family

## 2016-01-12 NOTE — Progress Notes (Unsigned)
Per phone call from [place] at [facility], patient has Renal Lesions and advised to follow up in 6 months.

## 2016-01-12 NOTE — Progress Notes (Signed)
Noted  

## 2016-01-15 ENCOUNTER — Telehealth: Payer: Self-pay | Admitting: Family

## 2016-01-15 NOTE — Telephone Encounter (Signed)
Please let pt know that there is a spot on his left kidney that the radiologist would like to recheck with another MRI in 6 months.

## 2016-01-17 ENCOUNTER — Encounter: Payer: Self-pay | Admitting: Internal Medicine

## 2016-01-17 ENCOUNTER — Ambulatory Visit (AMBULATORY_SURGERY_CENTER): Payer: 59 | Admitting: Internal Medicine

## 2016-01-17 VITALS — BP 128/89 | HR 59 | Temp 98.6°F | Resp 16

## 2016-01-17 DIAGNOSIS — R935 Abnormal findings on diagnostic imaging of other abdominal regions, including retroperitoneum: Secondary | ICD-10-CM | POA: Diagnosis not present

## 2016-01-17 DIAGNOSIS — K2980 Duodenitis without bleeding: Secondary | ICD-10-CM | POA: Diagnosis not present

## 2016-01-17 DIAGNOSIS — R1084 Generalized abdominal pain: Secondary | ICD-10-CM

## 2016-01-17 MED ORDER — SODIUM CHLORIDE 0.9 % IV SOLN: 500.0000 mL | INTRAVENOUS | Status: DC

## 2016-01-17 NOTE — Op Note (Signed)
[place] Patient Name: Alexander Hendricks Procedure Date: 01/17/2016 10:08 AM MRN: [id number] Endoscopist: Docia Chuck. Henrene Pastor , MD Age: 50 Date of Birth: 25-Aug-1966 Gender: Male Procedure:                Upper GI endoscopy Indications:              Dyspepsia Medicines:                Monitored Anesthesia Care Procedure:                Pre-Anesthesia Assessment:                           - Prior to the procedure, a History and Physical                            was performed, and patient medications and                            allergies were reviewed. The patient's tolerance of                            previous anesthesia was also reviewed. The risks                            and benefits of the procedure and the sedation                            options and risks were discussed with the patient.                            All questions were answered, and informed consent                            was obtained. Prior Anticoagulants: The patient has                            taken no previous anticoagulant or antiplatelet                            agents. ASA Grade Assessment: II - A patient with                            mild systemic disease. After reviewing the risks                            and benefits, the patient was deemed in                            satisfactory condition to undergo the procedure.                           After obtaining informed consent, the endoscope was  passed under direct vision. Throughout the                            procedure, the patient's blood pressure, pulse, and                            oxygen saturations were monitored continuously. The                            Model GIF-HQ190 [id number]) scope was introduced                            through the mouth, and advanced to the second part                            of duodenum. The upper GI endoscopy was                            accomplished  without difficulty. The patient                            tolerated the procedure well. Scope In: Scope Out: Findings:                 A widely patent Schatzki ring (acquired) was found                            in the lower third of the esophagus.                           The exam of the esophagus was otherwise normal.                           Mild inflammation characterized by erythema was                            found in the gastric antrum. Biopsies were taken                            with a cold forceps for Helicobacter pylori testing                            using CLOtest.                           The exam of the stomach was otherwise normal.                           Patchy mildly erythematous mucosa was found in the                            duodenal bulb.                           The exam of the duodenum was otherwise normal.  The cardia and gastric fundus were normal on                            retroflexion. Complications:            No immediate complications. Estimated Blood Loss:     Estimated blood loss: none. Impression:               - Widely patent Schatzki ring.                           - Gastritis. Biopsied.                           - Erythematous duodenopathy. Recommendation:           - Patient has a contact number available for                            emergencies. The signs and symptoms of potential                            delayed complications were discussed with the                            patient. Return to normal activities tomorrow.                            Written discharge instructions were provided to the                            patient.                           - Resume previous diet.                           - Continue present medications(omeprazole and                            ranitidine).                           - Await CLO results. Docia Chuck. Henrene Pastor, MD 01/17/2016 10:33:58 AM This report has  been signed electronically. CC Letter to:             [employer]

## 2016-01-17 NOTE — Progress Notes (Signed)
Called to room to assist during endoscopic procedure.  Patient ID and intended procedure confirmed with present staff. Received instructions for my participation in the procedure from the performing physician.  

## 2016-01-17 NOTE — Progress Notes (Signed)
To recovery, report to [place], RN, VSS 

## 2016-01-17 NOTE — Telephone Encounter (Signed)
Called patient with results of MRI. Patient states he would like to talk more in detail on Monday when he returns fo follow up. Advised radiologist recommended another MRI in 6 months.

## 2016-01-17 NOTE — Patient Instructions (Signed)
YOU HAD AN ENDOSCOPIC PROCEDURE TODAY AT [facility]:   Refer to the procedure report that was given to you for any specific questions about what was found during the examination.  If the procedure report does not answer your questions, please call your gastroenterologist to clarify.  If you requested that your care partner not be given the details of your procedure findings, then the procedure report has been included in a sealed envelope for you to review at your convenience later.  YOU SHOULD EXPECT: Some feelings of bloating in the abdomen. Passage of more gas than usual.  Walking can help get rid of the air that was put into your GI tract during the procedure and reduce the bloating. If you had a lower endoscopy (such as a colonoscopy or flexible sigmoidoscopy) you may notice spotting of blood in your stool or on the toilet paper. If you underwent a bowel prep for your procedure, you may not have a normal bowel movement for a few days.  Please Note:  You might notice some irritation and congestion in your nose or some drainage.  This is from the oxygen used during your procedure.  There is no need for concern and it should clear up in a day or so.  SYMPTOMS TO REPORT IMMEDIATELY:     Following upper endoscopy (EGD)  Vomiting of blood or coffee ground material  New chest pain or pain under the shoulder blades  Painful or persistently difficult swallowing  New shortness of breath  Fever of 100F or higher  Black, tarry-looking stools  For urgent or emergent issues, a gastroenterologist can be reached at any hour by calling [phone].   DIET: Your first meal following the procedure should be a small meal and then it is ok to progress to your normal diet. Heavy or fried foods are harder to digest and may make you feel nauseous or bloated.  Likewise, meals heavy in dairy and vegetables can increase bloating.  Drink plenty of fluids but you should avoid alcoholic beverages  for 24 hours.  ACTIVITY:  You should plan to take it easy for the rest of today and you should NOT DRIVE or use heavy machinery until tomorrow (because of the sedation medicines used during the test).    FOLLOW UP: Our staff will call the number listed on your records the next business day following your procedure to check on you and address any questions or concerns that you may have regarding the information given to you following your procedure. If we do not reach you, we will leave a message.  However, if you are feeling well and you are not experiencing any problems, there is no need to return our call.  We will assume that you have returned to your regular daily activities without incident.  If any biopsies were taken you will be contacted by phone or by letter within the next 1-3 weeks.  Please call us at [phone] if you have not heard about the biopsies in 3 weeks.    SIGNATURES/CONFIDENTIALITY: You and/or your care partner have signed paperwork which will be entered into your electronic medical record.  These signatures attest to the fact that that the information above on your After Visit Summary has been reviewed and is understood.  Full responsibility of the confidentiality of this discharge information lies with you and/or your care-partner.   Await biopsy results  Continue Omeprazole and Ranitidine  Continue usual diet and medications

## 2016-01-18 ENCOUNTER — Telehealth: Payer: Self-pay | Admitting: *Deleted

## 2016-01-18 LAB — HELICOBACTER PYLORI SCREEN-BIOPSY: UREASE: POSITIVE — AB

## 2016-01-18 NOTE — Telephone Encounter (Signed)
Message left

## 2016-01-19 ENCOUNTER — Other Ambulatory Visit: Payer: Self-pay

## 2016-01-19 MED ORDER — BIS SUBCIT-METRONID-TETRACYC 140-125-125 MG PO CAPS: 3.0000 | ORAL_CAPSULE | Freq: Three times a day (TID) | ORAL | Status: DC

## 2016-01-24 ENCOUNTER — Telehealth: Payer: Self-pay | Admitting: *Deleted

## 2016-01-24 ENCOUNTER — Telehealth: Payer: Self-pay | Admitting: Family

## 2016-01-24 ENCOUNTER — Ambulatory Visit (INDEPENDENT_AMBULATORY_CARE_PROVIDER_SITE_OTHER): Payer: 59 | Admitting: Family

## 2016-01-24 ENCOUNTER — Encounter: Payer: Self-pay | Admitting: Family

## 2016-01-24 VITALS — BP 130/90 | HR 73 | Temp 98.3°F | Resp 20 | Ht 69.0 in | Wt 204.2 lb

## 2016-01-24 DIAGNOSIS — B9681 Helicobacter pylori [H. pylori] as the cause of diseases classified elsewhere: Secondary | ICD-10-CM | POA: Insufficient documentation

## 2016-01-24 DIAGNOSIS — N289 Disorder of kidney and ureter, unspecified: Secondary | ICD-10-CM

## 2016-01-24 DIAGNOSIS — K2980 Duodenitis without bleeding: Secondary | ICD-10-CM

## 2016-01-24 DIAGNOSIS — M25561 Pain in right knee: Secondary | ICD-10-CM

## 2016-01-24 DIAGNOSIS — R0789 Other chest pain: Secondary | ICD-10-CM | POA: Diagnosis not present

## 2016-01-24 DIAGNOSIS — I10 Essential (primary) hypertension: Secondary | ICD-10-CM

## 2016-01-24 DIAGNOSIS — M25562 Pain in left knee: Secondary | ICD-10-CM

## 2016-01-24 NOTE — Patient Instructions (Signed)
Please call us to let us know the dose of lisinopril you are taking. Please go to the ER if you develop recurrent chest pain. You will be contacted about your referral to Sports medicine and cardiology.

## 2016-01-24 NOTE — Assessment & Plan Note (Signed)
Fair BP today. I have asked the patient to contact us with his current lisinopril dose.

## 2016-01-24 NOTE — Assessment & Plan Note (Signed)
Could not afford Pylera- I sent a note to dr. Henrene Pastor the prescribing provider to notify him so he can consider a less expensive alternative. He continues to have some nausea and GI discomfort which I believe is due to this.

## 2016-01-24 NOTE — Assessment & Plan Note (Signed)
Discussed that he will need to repeat MRI in 6 months to ensure stability.

## 2016-01-24 NOTE — Telephone Encounter (Signed)
BP Readings from Last 3 Encounters:  01/24/16 130/90  01/17/16 128/89  01/10/16 132/89    His last few BP's look ok per our records.  Please continue lisinopril 20mg  once daily. If he has a cuff at home, please check once daily for 1 week, then send me readings via my chart or call.  Otherwise, book nurse visit in 2 weeks please.

## 2016-01-24 NOTE — Progress Notes (Signed)
Subjective:    Patient ID: Alexander Hendricks, male    DOB: [birth year], 50 y.o.   MRN: [id number]  HPI  Alexander Hendricks is a 50 yr old male who presents today for follow up. He was here last on 12/28/15 to establish care.  Chief complaint at that time was GI discomfort, early satiety, and unintentional weight loss. He was also out of his BP medication and lisinopril was restarted.  CT of the abdomen/pelvis was performed due to his GI complaints. CT noted  soft tissue infiltration involving the wall of the cecum and ascending colon. Incidental finding was also made of indeterminate 7 mm left kidney lesion. MRI was recommended to further evaluate. MRI noted LEFT renal cortical lesion with mild post-contrast enhancement and internal septation. Lesion difficult to characterize due to small size. 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   He also met with Dr. Henrene Pastor (GI) for further evaluation of his GI complaints.  He was scheduled for colonoscopy.  Colonoscopy showed polyps, diverticula but was otherwise unremarkable.  He did test + for H pylori and was treated by GI.  Endo noted Schatzki ring, gastritis, duodenal erythematosis.  Since his last visit with [place] his weight is up 4 pounds.  Reports that he still has some nausea and GI discomfort.  He was unable to afford the Pylera.   HTN- he was advised to restart lisinopril. He is not sure what dose he is taking.  BP Readings from Last 3 Encounters:  01/24/16 130/90  01/17/16 128/89  01/10/16 132/89   He continues to have dizziness and some headaches.  However, her reports that he has had some intermittent left sided chest pain. CP occurs generally at rest. Reports CP is reproducible upon palpation.  Began 2 weeks ago. Denies SOB.  Denies current chest pain. Dizziness is worst in the heat in the summer.  Had ER visit for the same 11/14/15 with neg cardiac markers.    Review of Systems    see HPI  Past  Medical History  Diagnosis Date  . Multiple gastric ulcers   . Frequent headaches   . HTN (hypertension) 12/28/2015  . Benign prostatic hyperplasia      Social History   Social History  . Marital Status: Single    Spouse Name: N/A  . Number of Children: 4  . Years of Education: N/A   Occupational History  . machine operator    Social History Main Topics  . Smoking status: Current Every Day Smoker -- 1.00 packs/day for 35 years    Types: Cigarettes  . Smokeless tobacco: Never Used  . Alcohol Use: 0.0 oz/week    0 Standard drinks or equivalent per week     Comment: occassional  . Drug Use: 3.00 per week    Special: Marijuana  . Sexual Activity: Not on file   Other Topics Concern  . Not on file   Social History Narrative   Fiance   75 yr old son lives with pt (from previous marriage)   2 sons and 1 daughter- grown and out of the house (all here in [place])  Has 4 grandchildren   Works as a Glass blower/designer.   Completed graduate school   Enjoys watching son's sports   No pets       No past surgical history on file.  Family History  Problem Relation Age of Onset  . Cancer Mother  colon  . Rheum arthritis Mother   . Cancer Father     leukemia  . Alzheimer's disease Maternal Grandfather     No Known Allergies  Current Outpatient Prescriptions on File Prior to Visit  Medication Sig Dispense Refill  . busPIRone (BUSPAR) 10 MG tablet Take 10 mg by mouth 2 (two) times daily.    . finasteride (PROSCAR) 5 MG tablet Take 5 mg by mouth daily.    Marland Kitchen lisinopril (PRINIVIL,ZESTRIL) 20 MG tablet Take 1 tablet (20 mg total) by mouth daily. 30 tablet 3  . omeprazole (PRILOSEC) 40 MG capsule Take 1 capsule (40 mg total) by mouth daily. 30 capsule 3  . ranitidine (ZANTAC) 150 MG capsule Take 150 mg by mouth every evening. Reported on 01/03/2016    . bismuth-metronidazole-tetracycline (PYLERA) 140-125-125 MG capsule Take 3 capsules by mouth 4 (four) times daily -  before meals and  at bedtime. (Patient not taking: Reported on 01/24/2016) 168 capsule 0   No current facility-administered medications on file prior to visit.    BP 140/92 mmHg  Pulse 73  Temp(Src) 98.3 F (36.8 C) (Oral)  Resp 20  Ht '5\' 9"'  (1.753 m)  Wt 204 lb 3.2 oz (92.625 kg)  BMI 30.14 kg/m2  SpO2 100%    Objective:   Physical Exam  Constitutional: He is oriented to person, place, and time. He appears well-developed and well-nourished. No distress.  HENT:  Head: Normocephalic and atraumatic.  Right Ear: Tympanic membrane and ear canal normal.  Left Ear: Tympanic membrane and ear canal normal.  Cardiovascular: Normal rate and regular rhythm.   No murmur heard. Pulmonary/Chest: Effort normal and breath sounds normal. No respiratory distress. He has no wheezes. He has no rales.  Abdominal: Soft. Bowel sounds are normal. He exhibits no distension. There is no tenderness. There is no rebound.  Musculoskeletal: He exhibits no edema.  No knee swelling is noted.  Neurological: He is alert and oriented to person, place, and time.  Skin: Skin is warm and dry.  Psychiatric: He has a normal mood and affect. His behavior is normal. Thought content normal.          Assessment & Plan:  Atypical CP- refer to cardiology for further evaluation.    Dizziness/HA's- seem to happen more in the heat. I wonder if it may be due to dehydration. Discussed importance of adequate hydration. We discussed possibility of referral to vestibular rehab, but I am not sure that this is truly BPV so I am not sure that it would be worth his time commitment.  Monitor.   Bilateral Knee pain- will refer to sports medicine.

## 2016-01-24 NOTE — Telephone Encounter (Signed)
Can be reached: [phone] Pharmacy: [facility]  Reason for call: pt states he is taking lisinopril 20mg  1/day and was supposed to call back with the information. He said that his BP is still high. Please call pt as needed.

## 2016-01-24 NOTE — Telephone Encounter (Signed)
Pt states pharmacy told him Pylera would cost $900 and he cannot afford this. States [facility] was supposed to have faxed this to GI.  Please contact pt with outcome, thanks!

## 2016-01-24 NOTE — Progress Notes (Signed)
Pre visit review using our clinic review tool, if applicable. No additional management support is needed unless otherwise documented below in the visit note. 

## 2016-01-24 NOTE — Telephone Encounter (Signed)
Spoke to patient. Informed him that we have Pylera samples in the office but only 12 days worth. He will pick it up and start samples and we will call him when we get more in next week sometime. Patient verbalized understanding.

## 2016-01-24 NOTE — Telephone Encounter (Signed)
Notified pt and scheduled nurse visit for 02/08/16 at 4pm.

## 2016-01-25 ENCOUNTER — Telehealth: Payer: Self-pay

## 2016-01-25 ENCOUNTER — Other Ambulatory Visit: Payer: Self-pay

## 2016-01-25 MED ORDER — METRONIDAZOLE 250 MG PO TABS: 250.0000 mg | ORAL_TABLET | Freq: Four times a day (QID) | ORAL | Status: DC

## 2016-01-25 MED ORDER — TETRACYCLINE HCL 500 MG PO CAPS: 500.0000 mg | ORAL_CAPSULE | Freq: Four times a day (QID) | ORAL | Status: DC

## 2016-01-25 MED ORDER — BISMUTH SUBSALICYLATE 262 MG PO CHEW
524.0000 mg | CHEWABLE_TABLET | Freq: Three times a day (TID) | ORAL | Status: DC
Start: 2016-01-25 — End: 2016-01-25

## 2016-01-25 NOTE — Telephone Encounter (Signed)
-----   Message from Irene Shipper, MD sent at 01/25/2016 10:04 AM EDT ----- Could we give him the generic components of Pylera? Thanks Vaughan Basta ----- Message -----    From: Debbrah Alar, NP    Sent: 01/24/2016  11:21 AM      To: Irene Shipper, MD  Dr. Henrene Pastor,  I saw Alexander Hendricks today. Thank you for your help.  Just wanted to let you know that he was unable to afford the rx sent over for H. Pylori treatment and thus has not started.  I wanted to let you know so that you may be able to send something more affordable for him.  Thank you,  Lenna Sciara

## 2016-01-25 NOTE — Telephone Encounter (Signed)
Spoke with pt and he was given samples of pylera.

## 2016-01-27 ENCOUNTER — Encounter: Payer: Self-pay | Admitting: Family Medicine

## 2016-01-27 ENCOUNTER — Ambulatory Visit (INDEPENDENT_AMBULATORY_CARE_PROVIDER_SITE_OTHER): Payer: 59 | Admitting: Family Medicine

## 2016-01-27 ENCOUNTER — Telehealth: Payer: Self-pay | Admitting: Family

## 2016-01-27 VITALS — BP 153/100 | HR 66 | Ht 69.0 in | Wt 205.0 lb

## 2016-01-27 DIAGNOSIS — M25561 Pain in right knee: Secondary | ICD-10-CM | POA: Diagnosis not present

## 2016-01-27 DIAGNOSIS — M25562 Pain in left knee: Secondary | ICD-10-CM | POA: Diagnosis not present

## 2016-01-27 MED ORDER — METHYLPREDNISOLONE ACETATE 40 MG/ML IJ SUSP
40.0000 mg | Freq: Once | INTRAMUSCULAR | Status: AC
  Administered 2016-01-27: 40 mg via INTRA_ARTICULAR

## 2016-01-27 NOTE — Telephone Encounter (Signed)
Dr. Lorelei Pont, see below. Pt is currently taking lisinopril 20 mg daily per chart review and is already scheduled for RN BP check on 02/08/16 per Debbrah Alar. Please advise if any further recommendations.

## 2016-01-27 NOTE — Telephone Encounter (Signed)
Pt was offered an 11:15 appt with Dr. Larose Kells, [place] stated paid to many copays (30.00) this week and was a bit difficult to have another doctor's visit.

## 2016-01-27 NOTE — Patient Instructions (Addendum)
Your knee pain is due to arthritis. These are the different classes of medicine you can take for this: Tylenol 500mg  1-2 tabs three times a day for pain. Ibuprofen 600mg  three times a day with food OR Aleve 1-2 tabs twice a day with food only as needed. Glucosamine sulfate 750mg  twice a day is a supplement that may help. Capsaicin, aspercreme, or biofreeze topically up to four times a day may also help with pain. Cortisone injections are an option - you were given these today. If cortisone injections do not help, there are different types of shots that may help but they take longer to take effect. It's important that you continue to stay active. Straight leg raises, knee extensions 3 sets of 10 once a day (add ankle weight if these become too easy). Consider physical therapy to strengthen muscles around the joint that hurts to take pressure off of the joint itself. Shoe inserts with good arch support may be helpful. Heat or ice 15 minutes at a time 3-4 times a day as needed to help with pain. Water aerobics and cycling with low resistance are the best two types of exercise for arthritis. Follow up with me in 1 month or as needed.

## 2016-01-27 NOTE — Telephone Encounter (Signed)
Pt came in office stating was at Physical Therapy today and his BP was high (153/100), since PCP Inda Castle) not here pt was suggested to be seen by one of our providers that is in the office today. Pt refused and stated will wait for his next appt.

## 2016-01-27 NOTE — Telephone Encounter (Signed)
lm

## 2016-01-27 NOTE — Telephone Encounter (Signed)
Last BP readings in our chart  Called pt- he is worried that his BP has been running high, he has been on lisinopril 20 for about 2 months now. Asked him to take an extra 1/2 tablet for a total of 30 mg and he will see [place] for a nurse visit in about 2 weeks. He does not have a BP cuff at home  BP Readings from Last 3 Encounters:  01/27/16 153/100  01/24/16 130/90  01/17/16 128/89

## 2016-01-28 DIAGNOSIS — M25561 Pain in right knee: Secondary | ICD-10-CM | POA: Insufficient documentation

## 2016-01-28 DIAGNOSIS — M25562 Pain in left knee: Secondary | ICD-10-CM

## 2016-01-28 NOTE — Telephone Encounter (Signed)
Spoke with patient and told him I had the remaining Pylera samples for him to pick up so he could complete the 14 day course.  Patient is not able to come in during the week so I spoke with Rachel Bo in primary care who agreed to have me let the patient pick them up from their office tomorrow (Saturday).  Patient knows to go to [facility] tomorrow morning during their Saturday hours.

## 2016-01-28 NOTE — Assessment & Plan Note (Signed)
consistent with his history of DJD.  Radiograph report from 12/2014 confirmed presence of bilateral mild knee arthritis.  Discussed tylenol, nsaids, glucosamine, topical medications.  Shown home exercises to do daily.  Went ahead with bilateral intraarticular injections today.  Heat/ice if needed.  F/u in 1 month or prn.  After informed written consent, patient was seated on exam table. Left knee was prepped with alcohol swab and utilizing anteromedial approach, patient's left knee was injected intraarticularly with 3:1 marcaine: depomedrol. Patient tolerated the procedure well without immediate complications.  After informed written consent, patient was seated on exam table. Right knee was prepped with alcohol swab and utilizing anteromedial approach, patient's right knee was injected intraarticularly with 3:1 marcaine: depomedrol. Patient tolerated the procedure well without immediate complications.

## 2016-01-28 NOTE — Progress Notes (Signed)
PCP and consultation requested by: Nance Pear., NP  Subjective:   HPI: Patient is a 50 y.o. male here for bilateral knee pain.  Patient denies known injury. Reports having over 10 years of bilateral L > R knee pain. Pain is anterior and medial, sharp. Will radiate posteriorly. No numbness, skin changes. Denies swelling. Has tried knee brace, aspirin, pain medication in the past only. Pain level 5/10 currently, can get up to 10/10.  Past Medical History  Diagnosis Date  . Multiple gastric ulcers   . Frequent headaches   . HTN (hypertension) 12/28/2015  . Benign prostatic hyperplasia     Current Outpatient Prescriptions on File Prior to Visit  Medication Sig Dispense Refill  . bismuth-metronidazole-tetracycline (PYLERA) 140-125-125 MG capsule Take 3 capsules by mouth 4 (four) times daily -  before meals and at bedtime. (Patient not taking: Reported on 01/24/2016) 168 capsule 0  . busPIRone (BUSPAR) 10 MG tablet Take 10 mg by mouth 2 (two) times daily.    . finasteride (PROSCAR) 5 MG tablet Take 5 mg by mouth daily.    Marland Kitchen lisinopril (PRINIVIL,ZESTRIL) 20 MG tablet Take 1 tablet (20 mg total) by mouth daily. 30 tablet 3  . omeprazole (PRILOSEC) 40 MG capsule Take 1 capsule (40 mg total) by mouth daily. 30 capsule 3  . ranitidine (ZANTAC) 150 MG capsule Take 150 mg by mouth every evening. Reported on 01/03/2016     No current facility-administered medications on file prior to visit.    No past surgical history on file.  No Known Allergies  Social History   Social History  . Marital Status: Single    Spouse Name: N/A  . Number of Children: 4  . Years of Education: N/A   Occupational History  . machine operator    Social History Main Topics  . Smoking status: Current Every Day Smoker -- 1.00 packs/day for 35 years    Types: Cigarettes  . Smokeless tobacco: Never Used  . Alcohol Use: 0.0 oz/week    0 Standard drinks or equivalent per week     Comment:  occassional  . Drug Use: 3.00 per week    Special: Marijuana  . Sexual Activity: Not on file   Other Topics Concern  . Not on file   Social History Narrative   Fiance   72 yr old son lives with pt (from previous marriage)   2 sons and 1 daughter- grown and out of the house (all here in [place])  Has 4 grandchildren   Works as a Glass blower/designer.   Completed graduate school   Enjoys watching son's sports   No pets       Family History  Problem Relation Age of Onset  . Cancer Mother     colon  . Rheum arthritis Mother   . Cancer Father     leukemia  . Alzheimer's disease Maternal Grandfather     BP 153/100 mmHg  Pulse 66  Ht 5\' 9"  (1.753 m)  Wt 205 lb (92.987 kg)  BMI 30.26 kg/m2  Review of Systems: See HPI above.    Objective:  Physical Exam:  Gen: NAD, comfortable in exam room  Bilateral knees: No gross deformity, ecchymoses, effusion. TTP medial joint line on left > right knees.  No other tenderness. FROM. Negative ant/post drawers. Negative valgus/varus testing. Negative lachmanns. Negative mcmurrays, apleys, patellar apprehension. NV intact distally.    Assessment & Plan:  1. Bilateral knee pain - consistent with his history of DJD.  Radiograph report from 12/2014 confirmed presence of bilateral mild knee arthritis.  Discussed tylenol, nsaids, glucosamine, topical medications.  Shown home exercises to do daily.  Went ahead with bilateral intraarticular injections today.  Heat/ice if needed.  F/u in 1 month or prn.  After informed written consent, patient was seated on exam table. Left knee was prepped with alcohol swab and utilizing anteromedial approach, patient's left knee was injected intraarticularly with 3:1 marcaine: depomedrol. Patient tolerated the procedure well without immediate complications.  After informed written consent, patient was seated on exam table. Right knee was prepped with alcohol swab and utilizing anteromedial approach, patient's right  knee was injected intraarticularly with 3:1 marcaine: depomedrol. Patient tolerated the procedure well without immediate complications.

## 2016-02-08 ENCOUNTER — Ambulatory Visit (INDEPENDENT_AMBULATORY_CARE_PROVIDER_SITE_OTHER): Payer: 59 | Admitting: Family Medicine

## 2016-02-08 VITALS — BP 126/86 | HR 84

## 2016-02-08 DIAGNOSIS — I10 Essential (primary) hypertension: Secondary | ICD-10-CM

## 2016-02-08 MED ORDER — LISINOPRIL 30 MG PO TABS: 30.0000 mg | ORAL_TABLET | Freq: Every day | ORAL | Status: DC

## 2016-02-08 NOTE — Progress Notes (Signed)
RN blood check note reviewed. Agree with documention and plan. 

## 2016-02-08 NOTE — Progress Notes (Signed)
Pre visit review using our clinic review tool, if applicable. No additional management support is needed unless otherwise documented below in the visit note.  Per Dr. Lorelei Pont 01/27/16 phone note: Asked him to take an extra 1/2 tablet for a total of 30 mg and he will see [place] for a nurse visit in about 2 weeks.  Per Dr. Charlett Blake: Continue taking 30 mg lisinopril daily. Follow up with Melissa as scheduled and repeat BMET at follow-up appointment.  Pt aware of instructions and verbalized understanding. New rx sent to pharmacy for 30 mg tablets.   Dorrene German, RN  RN blood check note reviewed. Agree with documention and plan.

## 2016-02-14 ENCOUNTER — Ambulatory Visit: Payer: 59 | Admitting: Cardiology

## 2016-02-16 ENCOUNTER — Ambulatory Visit: Payer: 59 | Admitting: Internal Medicine

## 2016-03-27 ENCOUNTER — Telehealth: Payer: Self-pay | Admitting: Family

## 2016-03-27 NOTE — Telephone Encounter (Signed)
Alexander Hendricks-- see [facility] triage note. Pt scheduled appt for tomorrow.

## 2016-03-27 NOTE — Telephone Encounter (Signed)
Patient called stating that he has had and elevated BP all weekend. States it has been 147/100 and is now 142/100. He is dizzy and lightheaded. Transferred to [facility]. Spoke with Chriss Czar

## 2016-03-27 NOTE — Telephone Encounter (Addendum)
Follow up call made to patient. States he is scheduled to see M. O'sullivan tomorrow. States he has new onset Dizziness,today with lack of energy. BP has been elevated today= 142/100. Advised patient if is symptoms worsens, to go directly to ED or UC patient agreed.

## 2016-03-27 NOTE — Telephone Encounter (Signed)
Noted  

## 2016-03-27 NOTE — Telephone Encounter (Signed)
[facility] Day - Client TELEPHONE ADVICE RECORD [facility] Patient Name: Alexander Hendricks DOB: 04-16-1966 Initial Comment Caller states, light headed and dizzy, BP 142/100 -- I lost contact with the caller, tried to call back, but only received the voice mail -- Nurse Assessment Nurse: Dimas Chyle, RN, Dellis Filbert Date/Time Eilene Ghazi Time): 03/27/2016 9:52:08 AM Confirm and document reason for call. If symptomatic, describe symptoms. You must click the next button to save text entered. ---Caller states, light headed and dizzy, BP 142/100. High B/P since last week. Lisinopril 30 mg. Has the patient traveled out of the country within the last 30 days? ---No Does the patient have any new or worsening symptoms? ---Yes Will a triage be completed? ---Yes Related visit to physician within the last 2 weeks? ---N/A Does the PT have any chronic conditions? (i.e. diabetes, asthma, etc.) ---Yes List chronic conditions. ---HTN Is this a behavioral health or substance abuse call? ---No Guidelines Guideline Title Affirmed Question Affirmed Notes High Blood Pressure [1] Taking BP medications AND [2] feels is having side effects (e.g., impotence, cough, dizzy upon standing) Final Disposition User See PCP When Office is Open (within 3 days) Dimas Chyle, RN, Dellis Filbert Comments calling back to speak to nurse. Referrals REFERRED TO PCP OFFICE Disagree/Comply: Comply

## 2016-03-28 ENCOUNTER — Ambulatory Visit (INDEPENDENT_AMBULATORY_CARE_PROVIDER_SITE_OTHER): Payer: 59 | Admitting: Family

## 2016-03-28 ENCOUNTER — Encounter: Payer: Self-pay | Admitting: Family

## 2016-03-28 VITALS — BP 140/90 | HR 55 | Temp 98.1°F | Resp 16 | Ht 69.0 in | Wt 200.0 lb

## 2016-03-28 DIAGNOSIS — M542 Cervicalgia: Secondary | ICD-10-CM

## 2016-03-28 DIAGNOSIS — R42 Dizziness and giddiness: Secondary | ICD-10-CM

## 2016-03-28 DIAGNOSIS — I10 Essential (primary) hypertension: Secondary | ICD-10-CM

## 2016-03-28 DIAGNOSIS — G5600 Carpal tunnel syndrome, unspecified upper limb: Secondary | ICD-10-CM | POA: Insufficient documentation

## 2016-03-28 DIAGNOSIS — J302 Other seasonal allergic rhinitis: Secondary | ICD-10-CM

## 2016-03-28 DIAGNOSIS — G5602 Carpal tunnel syndrome, left upper limb: Secondary | ICD-10-CM | POA: Diagnosis not present

## 2016-03-28 LAB — CBC WITH DIFFERENTIAL/PLATELET
BASOS ABS: 0 10*3/uL (ref 0.0–0.1)
BASOS PCT: 0.4 % (ref 0.0–3.0)
Eosinophils Absolute: 0.2 10*3/uL (ref 0.0–0.7)
Eosinophils Relative: 1.7 % (ref 0.0–5.0)
HEMATOCRIT: 41.9 % (ref 39.0–52.0)
Hemoglobin: 13.8 g/dL (ref 13.0–17.0)
LYMPHS PCT: 17.9 % (ref 12.0–46.0)
Lymphs Abs: 1.7 10*3/uL (ref 0.7–4.0)
MCHC: 33 g/dL (ref 30.0–36.0)
MCV: 89.1 fl (ref 78.0–100.0)
MONOS PCT: 4.1 % (ref 3.0–12.0)
Monocytes Absolute: 0.4 10*3/uL (ref 0.1–1.0)
NEUTROS ABS: 7 10*3/uL (ref 1.4–7.7)
Neutrophils Relative %: 75.9 % (ref 43.0–77.0)
PLATELETS: 218 10*3/uL (ref 150.0–400.0)
RBC: 4.7 Mil/uL (ref 4.22–5.81)
RDW: 15.6 % — AB (ref 11.5–15.5)
WBC: 9.3 10*3/uL (ref 4.0–10.5)

## 2016-03-28 LAB — BASIC METABOLIC PANEL
BUN: 11 mg/dL (ref 6–23)
CHLORIDE: 107 meq/L (ref 96–112)
CO2: 30 meq/L (ref 19–32)
Calcium: 9.4 mg/dL (ref 8.4–10.5)
Creatinine, Ser: 1.1 mg/dL (ref 0.40–1.50)
GFR: 91.28 mL/min (ref >60.00)
Glucose, Bld: 76 mg/dL (ref 70–99)
Potassium: 4 mEq/L (ref 3.5–5.1)
SODIUM: 143 meq/L (ref 135–145)

## 2016-03-28 MED ORDER — LISINOPRIL 40 MG PO TABS: 40.0000 mg | ORAL_TABLET | Freq: Every day | ORAL | Status: DC

## 2016-03-28 MED ORDER — MELOXICAM 7.5 MG PO TABS
7.5000 mg | ORAL_TABLET | Freq: Every day | ORAL | Status: DC
Start: 2016-03-28 — End: 2016-07-14

## 2016-03-28 NOTE — Assessment & Plan Note (Signed)
Has been running above goal. Increase lisinopril form 30mg  to 40mg .

## 2016-03-28 NOTE — Patient Instructions (Addendum)
Increase lisinopril from 30mg  to 40mg . Begin meloxicam once daily for neck pain and carpal tunnel. Purchase an over the counter wrist splint for your left wrist and wear while sleeping and as able at home. Add claritin 10mg  once daily for your allergy symptoms. Schedule an eye exam at your earliest convenience. Drink plenty of water.   Call if new/worsening symptoms.

## 2016-03-28 NOTE — Assessment & Plan Note (Signed)
Trial of wrist brace and meloxicam.

## 2016-03-28 NOTE — Progress Notes (Signed)
Pre visit review using our clinic review tool, if applicable. No additional management support is needed unless otherwise documented below in the visit note. 

## 2016-03-28 NOTE — Progress Notes (Signed)
Subjective:    Patient ID: Alexander Hendricks, male    DOB: 1966-02-05, 50 y.o.   MRN: [id number]  HPI  Alexander Hendricks is a 50 yr old male who presents today for follow up of his blood pressure.  Pt reports that he has been checking his BP at [employer] and BP was running in the 140's/100.  He reports difficulty reading up close, distance vision is fine.  Reports associated light headedness and occasional dizziness. + allergy symptoms, not on antihistamine.  This began about 2 weeks ago.   Also reports neck stiffness/left arm numbness x 3-4 days.  Reports that the thumb and first 2 fingers tingle. He works as a Glass blower/designer.  BP Readings from Last 3 Encounters:  03/28/16 140/90  02/08/16 126/86  01/27/16 153/100   Review of Systems See HPI  Past Medical History  Diagnosis Date  . Multiple gastric ulcers   . Frequent headaches   . HTN (hypertension) 12/28/2015  . Benign prostatic hyperplasia   . H. pylori infection   . Schatzki's ring   . Colon polyps     adenomatous     Social History   Social History  . Marital Status: Single    Spouse Name: N/A  . Number of Children: 4  . Years of Education: N/A   Occupational History  . machine operator    Social History Main Topics  . Smoking status: Current Every Day Smoker -- 1.00 packs/day for 35 years    Types: Cigarettes  . Smokeless tobacco: Never Used  . Alcohol Use: 0.0 oz/week    0 Standard drinks or equivalent per week     Comment: occassional  . Drug Use: 3.00 per week    Special: Marijuana  . Sexual Activity: Not on file   Other Topics Concern  . Not on file   Social History Narrative   Fiance   79 yr old son lives with pt (from previous marriage)   2 sons and 1 daughter- grown and out of the house (all here in [place])  Has 4 grandchildren   Works as a Glass blower/designer.   Completed graduate school   Enjoys watching son's sports   No pets       No past surgical history on file.  Family History  Problem Relation  Age of Onset  . Cancer Mother     colon  . Rheum arthritis Mother   . Cancer Father     leukemia  . Alzheimer's disease Maternal Grandfather     No Known Allergies  Current Outpatient Prescriptions on File Prior to Visit  Medication Sig Dispense Refill  . finasteride (PROSCAR) 5 MG tablet Take 5 mg by mouth daily.    . ranitidine (ZANTAC) 150 MG capsule Take 150 mg by mouth every evening. Reported on 01/03/2016     No current facility-administered medications on file prior to visit.    BP 140/90 mmHg  Pulse 55  Temp(Src) 98.1 F (36.7 C) (Oral)  Resp 16  Ht 5\' 9"  (1.753 m)  Wt 200 lb (90.719 kg)  BMI 29.52 kg/m2  SpO2 100%       Objective:   Physical Exam  Constitutional: He is oriented to person, place, and time. He appears well-developed and well-nourished. No distress.  HENT:  Head: Normocephalic and atraumatic.  Right Ear: Tympanic membrane and ear canal normal.  Left Ear: Tympanic membrane and ear canal normal.  Eyes: EOM are normal. Pupils are equal, round, and reactive to  light.  Cardiovascular: Normal rate and regular rhythm.   No murmur heard. Pulmonary/Chest: Effort normal and breath sounds normal. No respiratory distress. He has no wheezes. He has no rales.  Musculoskeletal: He exhibits no edema.  Neurological: He is alert and oriented to person, place, and time. He exhibits normal muscle tone. Coordination normal.  + tinels left  Skin: Skin is warm and dry.  Psychiatric: He has a normal mood and affect. His behavior is normal. Thought content normal.          Assessment & Plan:  Musculoskeletal neck pain- trial of meloxicam.   Seasonal allergies- Suspect that this is contributing to his dizziness.  Trial of claritin.

## 2016-03-29 NOTE — Telephone Encounter (Signed)
Pt was seen by Melissa on 03/28/16.

## 2016-04-04 ENCOUNTER — Ambulatory Visit (INDEPENDENT_AMBULATORY_CARE_PROVIDER_SITE_OTHER): Payer: 59 | Admitting: Internal Medicine

## 2016-04-04 ENCOUNTER — Encounter: Payer: Self-pay | Admitting: Internal Medicine

## 2016-04-04 VITALS — BP 142/90 | HR 58 | Temp 98.1°F | Ht 69.0 in | Wt 193.1 lb

## 2016-04-04 DIAGNOSIS — F411 Generalized anxiety disorder: Secondary | ICD-10-CM | POA: Diagnosis not present

## 2016-04-04 DIAGNOSIS — R519 Headache, unspecified: Secondary | ICD-10-CM

## 2016-04-04 DIAGNOSIS — I10 Essential (primary) hypertension: Secondary | ICD-10-CM

## 2016-04-04 DIAGNOSIS — R51 Headache: Secondary | ICD-10-CM | POA: Diagnosis not present

## 2016-04-04 LAB — BASIC METABOLIC PANEL
BUN: 9 mg/dL (ref 6–23)
CO2: 30 mEq/L (ref 19–32)
CREATININE: 1.1 mg/dL (ref 0.40–1.50)
Calcium: 9.6 mg/dL (ref 8.4–10.5)
Chloride: 105 mEq/L (ref 96–112)
GFR: 91.27 mL/min (ref >60.00)
GLUCOSE: 86 mg/dL (ref 70–99)
POTASSIUM: 3.9 meq/L (ref 3.5–5.1)
Sodium: 139 mEq/L (ref 135–145)

## 2016-04-04 LAB — SEDIMENTATION RATE: SED RATE: 2 mm/h (ref 0–15)

## 2016-04-04 MED ORDER — ESCITALOPRAM OXALATE 10 MG PO TABS: 10.0000 mg | ORAL_TABLET | Freq: Every day | ORAL | Status: DC

## 2016-04-04 MED ORDER — AMLODIPINE BESYLATE 5 MG PO TABS: 5.0000 mg | ORAL_TABLET | Freq: Every day | ORAL | Status: DC

## 2016-04-04 NOTE — Progress Notes (Signed)
Pre visit review using our clinic review tool, if applicable. No additional management support is needed unless otherwise documented below in the visit note. 

## 2016-04-04 NOTE — Patient Instructions (Addendum)
GO TO THE LAB : Get the blood work     For blood pressure: Add amlodipine 1 tablet daily  For anxiety:  Start Lexapro 1 tablet every night    Check the  blood pressure   daily Be sure your blood pressure is between 110/65 and  145/85. If it is consistently higher or lower, let me know   If you have severe symptoms: Go to the ER  Keep the appointment to see Lenna Sciara  Get me a copy of the brain MRI

## 2016-04-04 NOTE — Progress Notes (Signed)
Subjective:    Patient ID: Alexander Hendricks, male    DOB: April 28, 1966, 50 y.o.   MRN: [id number]  DOS:  04/04/2016 Type of visit - description : Acute visit Interval history: Patient is here for his blood pressure but also has several symptoms, some chronic. This is how he describes his symptoms: Dizziness for many years 4 weeks history of blurred vision 2 or 3 weeks h/o  feeling tingly at the whole left side including face, arm and leg. Headaches for at least a year. States "I don't know what anxiety is but they treatment for that and I felt better". When asked if he feels uneasy, nervous he said yes Recently his BP was elevated, on 03/28/2016 lisinopril dose was increased, since then he has changed his BP and is 145/100, 147/107.    Review of Systems No chest pain, difficulty breathing or palpitations No depression No diplopia per se, slurred speech. Yesterday left leg was a slightly weak, L arm has been slightly weak 2 times over the last few days  Past Medical History  Diagnosis Date  . Multiple gastric ulcers   . Frequent headaches   . HTN (hypertension) 12/28/2015  . Benign prostatic hyperplasia   . H. pylori infection   . Schatzki's ring   . Colon polyps     adenomatous    History reviewed. No pertinent past surgical history.  Social History   Social History  . Marital Status: Single    Spouse Name: N/A  . Number of Children: 4  . Years of Education: N/A   Occupational History  . machine operator    Social History Main Topics  . Smoking status: Current Every Day Smoker -- 1.00 packs/day for 35 years    Types: Cigarettes  . Smokeless tobacco: Never Used  . Alcohol Use: 0.0 oz/week    0 Standard drinks or equivalent per week     Comment: occassional  . Drug Use: 3.00 per week    Special: Marijuana  . Sexual Activity: Not on file   Other Topics Concern  . Not on file   Social History Narrative   Fiance   18 yr old son lives with pt (from previous  marriage)   2 sons and 1 daughter- grown and out of the house (all here in [place])  Has 4 grandchildren   Works as a Glass blower/designer.   Completed graduate school   Enjoys watching son's sports   No pets           Medication List       This list is accurate as of: 04/04/16  5:04 PM.  Always use your most recent med list.               amLODipine 5 MG tablet  Commonly known as:  NORVASC  Take 1 tablet (5 mg total) by mouth daily.     escitalopram 10 MG tablet  Commonly known as:  LEXAPRO  Take 1 tablet (10 mg total) by mouth daily.     finasteride 5 MG tablet  Commonly known as:  PROSCAR  Take 5 mg by mouth daily.     lisinopril 40 MG tablet  Commonly known as:  PRINIVIL,ZESTRIL  Take 1 tablet (40 mg total) by mouth daily.     meloxicam 7.5 MG tablet  Commonly known as:  MOBIC  Take 1 tablet (7.5 mg total) by mouth daily.     ranitidine 150 MG capsule  Commonly known as:  ZANTAC  Take 150 mg by mouth every evening. Reported on 01/03/2016           Objective:   Physical Exam BP 142/90 mmHg  Pulse 58  Temp(Src) 98.1 F (36.7 C) (Oral)  Ht 5\' 9"  (1.753 m)  Wt 193 lb 2 oz (87.601 kg)  BMI 28.51 kg/m2  SpO2 98%  General:   Well developed, well nourished . NAD.  Neck: Full range of motion  HEENT:  Normocephalic . Face symmetric, atraumatic Lungs:  CTA B Normal respiratory effort, no intercostal retractions, no accessory muscle use. Heart: RRR,  no murmur.  No pretibial edema bilaterally  Abdomen:  Not distended, soft, non-tender. No rebound or rigidity.  No bruit Skin: Exposed areas without rash. Not pale. Not jaundice Neurologic:  alert & oriented X3.  Speech normal, gait appropriate for age and unassisted Strength symmetric and appropriate for age.  EOMI, pupils equal and reactive, DTRs symmetric. Psych: Cognition and judgment appear intact.  Cooperative with normal attention span and concentration.  Behavior appropriate. Mild to moderate anxious,  no depressed appearing.    Assessment & Plan:   Patient presents with multiple issues. HTN: Needs better control, lisinopril recently increased to 40 mg, add amlodipine 5. Low-salt diet discussed. Recommend to avoid NSAIDs. Recheck a BMP, monitor amb BP Headache, dizziness, blurred vision, L tingling: Some of the symptoms are either chronic or going on for several weeks, neurological exam is normal. Plan: Brain MRI, sedimentation rate, treat HTN, treat anxiety. Addendum- declined a MRI "I had it done already". Explained patient I don't see a copy or evidence that it was done. States he will give me a copy. I explained my concern about a stroke. Anxiety: He looks moderately anxious today, reports that he felt better when he took anxiety medication. Will recommend Lexapro. Request a work note, 2 days excuse provided RTC to see pcp as schedule next month

## 2016-04-14 ENCOUNTER — Telehealth: Payer: Self-pay | Admitting: Family

## 2016-04-14 NOTE — Telephone Encounter (Signed)
Chart reviewed, do not see where you have ever Rx'ed Finasteride. Please advise.

## 2016-04-14 NOTE — Telephone Encounter (Signed)
Relation to [id number] Call back [phone] Pharmacy: [facility] [address] [phone] (Phone) [phone] (Fax)         Reason for call:  Patient requesting a refill of finasteride (PROSCAR) 5 MG tablet states he has been having trouble urinating and very important NP fills medication.

## 2016-04-15 MED ORDER — FINASTERIDE 5 MG PO TABS: 5.0000 mg | ORAL_TABLET | Freq: Every day | ORAL | Status: DC

## 2016-04-24 ENCOUNTER — Ambulatory Visit: Payer: 59 | Admitting: Family

## 2016-04-26 ENCOUNTER — Telehealth: Payer: Self-pay | Admitting: Family

## 2016-04-26 NOTE — Telephone Encounter (Signed)
Patient was No Show 7/17. Charge or No Charge

## 2016-04-26 NOTE — Telephone Encounter (Signed)
Yes please

## 2016-04-27 ENCOUNTER — Encounter: Payer: Self-pay | Admitting: Family

## 2016-07-14 ENCOUNTER — Encounter: Payer: Self-pay | Admitting: Family Medicine

## 2016-07-14 ENCOUNTER — Telehealth: Payer: Self-pay | Admitting: Family

## 2016-07-14 ENCOUNTER — Ambulatory Visit (INDEPENDENT_AMBULATORY_CARE_PROVIDER_SITE_OTHER): Payer: 59 | Admitting: Family Medicine

## 2016-07-14 VITALS — BP 120/50 | HR 71 | Temp 98.3°F | Ht 69.0 in | Wt 197.2 lb

## 2016-07-14 DIAGNOSIS — R251 Tremor, unspecified: Secondary | ICD-10-CM | POA: Diagnosis not present

## 2016-07-14 DIAGNOSIS — R42 Dizziness and giddiness: Secondary | ICD-10-CM

## 2016-07-14 DIAGNOSIS — N289 Disorder of kidney and ureter, unspecified: Secondary | ICD-10-CM

## 2016-07-14 LAB — COMPREHENSIVE METABOLIC PANEL
ALK PHOS: 64 U/L (ref 39–117)
ALT: 22 U/L (ref 0–53)
AST: 15 U/L (ref 0–37)
Albumin: 4.1 g/dL (ref 3.5–5.2)
BUN: 11 mg/dL (ref 6–23)
CHLORIDE: 104 meq/L (ref 96–112)
CO2: 28 mEq/L (ref 19–32)
Calcium: 9.5 mg/dL (ref 8.4–10.5)
Creatinine, Ser: 1.18 mg/dL (ref 0.40–1.50)
GFR: 84.07 mL/min (ref >60.00)
GLUCOSE: 124 mg/dL — AB (ref 70–99)
POTASSIUM: 3.5 meq/L (ref 3.5–5.1)
SODIUM: 140 meq/L (ref 135–145)
TOTAL PROTEIN: 7.2 g/dL (ref 6.0–8.3)
Total Bilirubin: 0.7 mg/dL (ref 0.2–1.2)

## 2016-07-14 LAB — CBC
HEMATOCRIT: 40 % (ref 39.0–52.0)
HEMOGLOBIN: 13.3 g/dL (ref 13.0–17.0)
MCHC: 33.3 g/dL (ref 30.0–36.0)
MCV: 88 fl (ref 78.0–100.0)
PLATELETS: 225 10*3/uL (ref 150.0–400.0)
RBC: 4.55 Mil/uL (ref 4.22–5.81)
RDW: 14.4 % (ref 11.5–15.5)
WBC: 8.3 10*3/uL (ref 4.0–10.5)

## 2016-07-14 LAB — HEMOGLOBIN A1C: Hgb A1c MFr Bld: 5.8 % (ref 4.6–6.5)

## 2016-07-14 NOTE — Progress Notes (Signed)
Chief Complaint  Patient presents with  . Dizziness    since Tues (comes and goes)-pt states his body feels like it's shaking,along with H/A's  . Numbness    (L) side since Tues    Subjective: Patient is a 50 y.o. male here for dizziness.  Feels like he is going to pass out. The room is not spinning. Lasts for several hours as of yesterday. Had one episode today, but resolved when he got out of shower. HA started around the same time, +hx of these. Not treated for migraines. No palpitations, current weakness, injury, recent illness, medication change, or N/V.  ROS: Heart: Denies chest pain or palpitations Neuro: +numbness  Family History  Problem Relation Age of Onset  . Cancer Mother     colon  . Rheum arthritis Mother   . Cancer Father     leukemia  . Alzheimer's disease Maternal Grandfather    Past Medical History:  Diagnosis Date  . Benign prostatic hyperplasia   . Colon polyps    adenomatous  . Frequent headaches   . H. pylori infection   . HTN (hypertension) 12/28/2015  . Multiple gastric ulcers   . Schatzki's ring    No Known Allergies  Current Outpatient Prescriptions:  .  amLODipine (NORVASC) 5 MG tablet, Take 1 tablet (5 mg total) by mouth daily., Disp: 30 tablet, Rfl: 6 .  escitalopram (LEXAPRO) 10 MG tablet, Take 1 tablet (10 mg total) by mouth daily., Disp: 30 tablet, Rfl: 1 .  finasteride (PROSCAR) 5 MG tablet, Take 1 tablet (5 mg total) by mouth daily., Disp: 30 tablet, Rfl: 5 .  lisinopril (PRINIVIL,ZESTRIL) 40 MG tablet, Take 1 tablet (40 mg total) by mouth daily., Disp: 30 tablet, Rfl: 3 .  ranitidine (ZANTAC) 150 MG capsule, Take 150 mg by mouth every evening. Reported on 01/03/2016, Disp: , Rfl:   Objective: BP (!) 120/50 (BP Location: Left Arm, Patient Position: Sitting, Cuff Size: Large)   Pulse 71   Temp 98.3 F (36.8 C) (Oral)   Ht 5\' 9"  (1.753 m)   Wt 197 lb 3.2 oz (89.4 kg)   SpO2 98%   BMI 29.12 kg/m  General: Awake, appears stated  age HEENT: MMM, EOMi, PERRLA Heart: RRR, no murmurs, no bruits or LE edema Lungs: CTAB, no rales, wheezes or rhonchi. Normal effort Neuro: 2/4 patellar and biceps reflex b/l wo clonus, 0/4 calcaneal reflex b/l, no cerebellar signs, fluent speech MSK: Gait normal, strength 5/5 throughout Psych: Age appropriate judgment and insight, normal affect and mood  Assessment and Plan: Light headed - Plan: CBC  Shaking - Plan: Comprehensive metabolic panel, CBC, HgB 123456  Orders as above. EKG is normal. Strange hx. Could be combination of hypoglycemia, orthostasis, possibly related to migraines. CNS etiology doubtful. Anxiety also a possibility given the lack of symptoms once he made the appt. Will r/o hypoglycemia or electrolyte issue. F/u prn. UC or ER if symptoms worsen. The patient voiced understanding and agreement to the plan.  [place], DO 07/14/16  11:09 AM

## 2016-07-14 NOTE — Telephone Encounter (Signed)
Please let pt know that he is due for a follow up MRI to evaluate the spot on his left kidney. I have pended MRI.

## 2016-07-14 NOTE — Patient Instructions (Signed)

## 2016-07-14 NOTE — Progress Notes (Signed)
Pre visit review using our clinic review tool, if applicable. No additional management support is needed unless otherwise documented below in the visit note. 

## 2016-07-18 NOTE — Telephone Encounter (Signed)
Notified pt and he is agreeable to proceed with MRI. Order signed. 

## 2016-07-22 ENCOUNTER — Ambulatory Visit (HOSPITAL_BASED_OUTPATIENT_CLINIC_OR_DEPARTMENT_OTHER)
Admission: RE | Admit: 2016-07-22 | Discharge: 2016-07-22 | Disposition: A | Discharge status: Home / Self Care | Payer: 59 | Source: Ambulatory Visit | Attending: Family | Admitting: Family

## 2016-07-22 DIAGNOSIS — N289 Disorder of kidney and ureter, unspecified: Secondary | ICD-10-CM | POA: Insufficient documentation

## 2016-07-22 MED ORDER — GADOBENATE DIMEGLUMINE 529 MG/ML IV SOLN: 18.0000 mL | Freq: Once | INTRAVENOUS | Status: DC | PRN

## 2016-07-29 ENCOUNTER — Telehealth: Payer: Self-pay | Admitting: Internal Medicine

## 2016-08-01 NOTE — Telephone Encounter (Signed)
Refill sent but pt is due for follow up please.

## 2016-08-01 NOTE — Telephone Encounter (Signed)
Pt has been scheduled.  °

## 2016-08-07 ENCOUNTER — Telehealth: Payer: Self-pay | Admitting: Family

## 2016-08-07 MED ORDER — LISINOPRIL 40 MG PO TABS
40.0000 mg | ORAL_TABLET | Freq: Every day | ORAL | 0 refills | Status: DC
Start: 2016-08-07 — End: 2016-08-14

## 2016-08-07 NOTE — Telephone Encounter (Signed)
Pt has f/u on 08/14/16. 30 day supply sent to pharmacy,

## 2016-08-07 NOTE — Telephone Encounter (Addendum)
Relation to [id number] Call back [phone] Pharmacy: [facility] [place] [phone] (Phone) [phone])     Reason for call:  Patient requesting a refill lisinopril (PRINIVIL,ZESTRIL) 40 MG tablet

## 2016-08-14 ENCOUNTER — Encounter: Payer: Self-pay | Admitting: Family

## 2016-08-14 ENCOUNTER — Ambulatory Visit (INDEPENDENT_AMBULATORY_CARE_PROVIDER_SITE_OTHER): Payer: 59 | Admitting: Family

## 2016-08-14 DIAGNOSIS — N289 Disorder of kidney and ureter, unspecified: Secondary | ICD-10-CM

## 2016-08-14 DIAGNOSIS — G43809 Other migraine, not intractable, without status migrainosus: Secondary | ICD-10-CM

## 2016-08-14 DIAGNOSIS — I10 Essential (primary) hypertension: Secondary | ICD-10-CM | POA: Diagnosis not present

## 2016-08-14 DIAGNOSIS — F411 Generalized anxiety disorder: Secondary | ICD-10-CM | POA: Diagnosis not present

## 2016-08-14 DIAGNOSIS — G43909 Migraine, unspecified, not intractable, without status migrainosus: Secondary | ICD-10-CM | POA: Insufficient documentation

## 2016-08-14 MED ORDER — ESCITALOPRAM OXALATE 10 MG PO TABS: 10.0000 mg | ORAL_TABLET | Freq: Every day | ORAL | 5 refills | Status: DC

## 2016-08-14 MED ORDER — RANITIDINE HCL 150 MG PO CAPS: 150.0000 mg | ORAL_CAPSULE | Freq: Every evening | ORAL | 5 refills | Status: DC

## 2016-08-14 MED ORDER — SUMATRIPTAN SUCCINATE 50 MG PO TABS: ORAL_TABLET | ORAL | 3 refills | Status: DC

## 2016-08-14 MED ORDER — AMLODIPINE BESYLATE 5 MG PO TABS: 5.0000 mg | ORAL_TABLET | Freq: Every day | ORAL | 6 refills | Status: DC

## 2016-08-14 MED ORDER — LISINOPRIL 40 MG PO TABS: 40.0000 mg | ORAL_TABLET | Freq: Every day | ORAL | 5 refills | Status: DC

## 2016-08-14 NOTE — Progress Notes (Signed)
Pre visit review using our clinic review tool, if applicable. No additional management support is needed unless otherwise documented below in the visit note. 

## 2016-08-14 NOTE — Assessment & Plan Note (Signed)
Stable on lexapro, continue same.  

## 2016-08-14 NOTE — Assessment & Plan Note (Signed)
Follow up MRI 10/16 recommend additional 6 month follow up:  1. No substantial interval change in the 8-9 mm left upper pole renal lesion, again apparently having some subtle septal versus peripheral enhancement. Lesion remains difficult to definitively characterize. It was visible on the study from almost 5 years ago and measured 6 mm at that time. 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 As such, repeat MRI without and with contrast in 6 months is recommended to ensure continued stability.

## 2016-08-14 NOTE — Assessment & Plan Note (Signed)
Will add trial of prn imitrex.  I think his symptoms are most likely related to migraines.  I will request his records from neurology as well.

## 2016-08-14 NOTE — Progress Notes (Signed)
Subjective:    Patient ID: Alexander Hendricks, male    DOB: [birth year], 50 y.o.   MRN: [id number]  HPI  Alexander Hendricks is a 50 yr old male who presents today for follow up.  HE was seen earlier this month with c/o dizziness and headaches. Symptoms worsened and he was seen by Dr. Nani Ravens.  A1C, CBC CMET unremarkable except mild hyperglycemia (sugar 124).  Notes that symptoms seem to be worse in the hot months than the cold months. Notes that the dizziness does not happen every day. Last episode was yesterday. Seems to occur mostly during the AM's. Sometimes it resolves and other days it continues. He reports that he drinks 2-3 12-16 oz drinks a work (water or gatorade). He denies worsening of his symptoms after starting proscar. He reports that he feels "light headed" when these symptoms occur.  He reports that his symptoms are always associated with a headache.  Headaches can be as bad as 7/10.    He reports that he has a remote hx of panic attacks 3-4 years ago.  He is stable on lexapro.    HTN- he is maintained on amlodipine 5mg .   BP Readings from Last 3 Encounters:  08/14/16 118/82  07/14/16 (!) 120/50  04/04/16 (!) 142/90   Review of Systems   See HPI  Past Medical History:  Diagnosis Date  . Benign prostatic hyperplasia   . Colon polyps    adenomatous  . Frequent headaches   . H. pylori infection   . HTN (hypertension) 12/28/2015  . Multiple gastric ulcers   . Schatzki's ring      Social History   Social History  . Marital status: Single    Spouse name: N/A  . Number of children: 4  . Years of education: N/A   Occupational History  . machine operator    Social History Main Topics  . Smoking status: Current Every Day Smoker    Packs/day: 1.00    Years: 35.00    Types: Cigarettes  . Smokeless tobacco: Never Used  . Alcohol use 0.0 oz/week     Comment: occassional  . Drug use:     Frequency: 3.0 times per week    Types: Marijuana  . Sexual activity: Not on file    Other Topics Concern  . Not on file   Social History Narrative   Fiance   15 yr old son lives with pt (from previous marriage)   2 sons and 1 daughter- grown and out of the house (all here in [place])  Has 4 grandchildren   Works as a Glass blower/designer.   Completed graduate school   Enjoys watching son's sports   No pets       No past surgical history on file.  Family History  Problem Relation Age of Onset  . Cancer Mother     colon  . Rheum arthritis Mother   . Cancer Father     leukemia  . Alzheimer's disease Maternal Grandfather     No Known Allergies  Current Outpatient Prescriptions on File Prior to Visit  Medication Sig Dispense Refill  . amLODipine (NORVASC) 5 MG tablet Take 1 tablet (5 mg total) by mouth daily. 30 tablet 6  . escitalopram (LEXAPRO) 10 MG tablet TAKE ONE TABLET BY MOUTH ONCE DAILY 30 tablet 0  . finasteride (PROSCAR) 5 MG tablet Take 1 tablet (5 mg total) by mouth daily. 30 tablet 5  . lisinopril (PRINIVIL,ZESTRIL) 40 MG tablet Take  1 tablet (40 mg total) by mouth daily. 30 tablet 0  . ranitidine (ZANTAC) 150 MG capsule Take 150 mg by mouth every evening. Reported on 01/03/2016     No current facility-administered medications on file prior to visit.     BP 118/82 (BP Location: Right Arm, Cuff Size: Normal)   Pulse 74   Temp 98.5 F (36.9 C) (Oral)   Resp 16   Ht 5\' 9"  (1.753 m)   Wt 197 lb (89.4 kg)   SpO2 99% Comment: room air  BMI 29.09 kg/m       Objective:   Physical Exam  Constitutional: He is oriented to person, place, and time. He appears well-developed and well-nourished. No distress.  HENT:  Head: Normocephalic and atraumatic.  Right Ear: Tympanic membrane and ear canal normal.  Left Ear: Tympanic membrane and ear canal normal.  Mouth/Throat: No oropharyngeal exudate, posterior oropharyngeal edema or posterior oropharyngeal erythema.  Eyes: Pupils are equal, round, and reactive to light.  Cardiovascular: Normal rate and  regular rhythm.   No murmur heard. Pulmonary/Chest: Effort normal and breath sounds normal. No respiratory distress. He has no wheezes. He has no rales.  Musculoskeletal: He exhibits no edema.  Neurological: He is alert and oriented to person, place, and time.  Skin: Skin is warm and dry.  Psychiatric: He has a normal mood and affect. His behavior is normal. Thought content normal.          Assessment & Plan:

## 2016-08-14 NOTE — Patient Instructions (Signed)
You may use imitrex as needed for headaches. We will try to get your records from the neurologist.

## 2016-08-14 NOTE — Assessment & Plan Note (Signed)
Stable on amlodipine. No orthostasis noted today.  Continue current dose.

## 2016-10-05 ENCOUNTER — Encounter: Payer: Self-pay | Admitting: Medical

## 2016-10-05 ENCOUNTER — Ambulatory Visit (INDEPENDENT_AMBULATORY_CARE_PROVIDER_SITE_OTHER): Payer: 59 | Admitting: Medical

## 2016-10-05 VITALS — BP 110/70 | HR 64 | Temp 98.0°F | Wt 201.0 lb

## 2016-10-05 DIAGNOSIS — J0100 Acute maxillary sinusitis, unspecified: Secondary | ICD-10-CM | POA: Diagnosis not present

## 2016-10-05 DIAGNOSIS — J111 Influenza due to unidentified influenza virus with other respiratory manifestations: Secondary | ICD-10-CM

## 2016-10-05 MED ORDER — BENZONATATE 100 MG PO CAPS: 100.0000 mg | ORAL_CAPSULE | Freq: Three times a day (TID) | ORAL | 0 refills | Status: DC | PRN

## 2016-10-05 MED ORDER — AZITHROMYCIN 250 MG PO TABS
ORAL_TABLET | ORAL | 0 refills | Status: DC
Start: 2016-10-05 — End: 2017-03-16

## 2016-10-05 MED ORDER — OSELTAMIVIR PHOSPHATE 75 MG PO CAPS: 75.0000 mg | ORAL_CAPSULE | Freq: Two times a day (BID) | ORAL | 0 refills | Status: DC

## 2016-10-05 MED ORDER — FLUTICASONE PROPIONATE 50 MCG/ACT NA SUSP
2.0000 | Freq: Every day | NASAL | 1 refills | Status: DC
Start: 2016-10-05 — End: 2017-05-24

## 2016-10-05 MED ORDER — AMLODIPINE BESYLATE 5 MG PO TABS: 5.0000 mg | ORAL_TABLET | Freq: Every day | ORAL | 6 refills | Status: DC

## 2016-10-05 NOTE — Progress Notes (Signed)
Subjective:    Patient ID: Alexander Hendricks, male    DOB: [birth year], 50 y.o.   MRN: [id number]  HPI  Pt had flu like syndrome since christmas day.  Fever, chills, sweats and bodyaches all over. Severe fatigue that day. Pt achiness has been tapering off. Pt still feels week. Wife had + flu type A.  Some sinus pressure presently and since onset.   Review of Systems  Constitutional: Positive for chills, fatigue and fever.  HENT: Positive for congestion, sinus pain and sinus pressure. Negative for sore throat.   Respiratory: Positive for cough. Negative for chest tightness, shortness of breath and wheezing.   Cardiovascular: Negative for chest pain and palpitations.  Gastrointestinal: Negative for abdominal pain.  Musculoskeletal: Positive for myalgias. Negative for back pain.  Skin: Negative for rash.  Hematological: Negative for adenopathy. Does not bruise/bleed easily.  Psychiatric/Behavioral: Negative for behavioral problems and confusion.    Past Medical History:  Diagnosis Date  . Benign prostatic hyperplasia   . Colon polyps    adenomatous  . Frequent headaches   . H. pylori infection   . HTN (hypertension) 12/28/2015  . Multiple gastric ulcers   . Schatzki's ring      Social History   Social History  . Marital status: Single    Spouse name: N/A  . Number of children: 4  . Years of education: N/A   Occupational History  . machine operator    Social History Main Topics  . Smoking status: Current Every Day Smoker    Packs/day: 1.00    Years: 35.00    Types: Cigarettes  . Smokeless tobacco: Never Used  . Alcohol use 0.0 oz/week     Comment: occassional  . Drug use:     Frequency: 3.0 times per week    Types: Marijuana  . Sexual activity: Not on file   Other Topics Concern  . Not on file   Social History Narrative   Fiance   98 yr old son lives with pt (from previous marriage)   2 sons and 1 daughter- grown and out of the house (all here in [place])  Has 4  grandchildren   Works as a Glass blower/designer.   Completed graduate school   Enjoys watching son's sports   No pets       No past surgical history on file.  Family History  Problem Relation Age of Onset  . Cancer Mother     colon  . Rheum arthritis Mother   . Cancer Father     leukemia  . Alzheimer's disease Maternal Grandfather     No Known Allergies  Current Outpatient Prescriptions on File Prior to Visit  Medication Sig Dispense Refill  . escitalopram (LEXAPRO) 10 MG tablet Take 1 tablet (10 mg total) by mouth daily. 30 tablet 5  . finasteride (PROSCAR) 5 MG tablet Take 1 tablet (5 mg total) by mouth daily. 30 tablet 5  . lisinopril (PRINIVIL,ZESTRIL) 40 MG tablet Take 1 tablet (40 mg total) by mouth daily. 30 tablet 5  . ranitidine (ZANTAC) 150 MG capsule Take 1 capsule (150 mg total) by mouth every evening. Reported on 01/03/2016 30 capsule 5  . SUMAtriptan (IMITREX) 50 MG tablet 1 tab at start of migraine, may repeat in 2 hours. Max 2 tabs in 24 hours 10 tablet 3   No current facility-administered medications on file prior to visit.     BP 110/70   Pulse 64   Temp 98 F (  36.7 C) (Oral)   Wt 201 lb (91.2 kg)   SpO2 98%   BMI 29.68 kg/m       Objective:   Physical Exam  General  Mental Status - Alert. General Appearance - Well groomed. Not in acute distress.  Skin Rashes- No Rashes.  HEENT Head- Normal. Ear Auditory Canal - Left- Normal. Right - Normal.Tympanic Membrane- Left- Normal. Right- Normal. Eye Sclera/Conjunctiva- Left- Normal. Right- Normal. Nose & Sinuses Nasal Mucosa- Left-  Boggy and Congested. Right-  Boggy and  Congested.Bilateral maxillary and frontal sinus pressure. Mouth & Throat Lips: Upper Lip- Normal: no dryness, cracking, pallor, cyanosis, or vesicular eruption. Lower Lip-Normal: no dryness, cracking, pallor, cyanosis or vesicular eruption. Buccal Mucosa- Bilateral- No Aphthous ulcers. Oropharynx- No Discharge or  Erythema. Tonsils: Characteristics- Bilateral- No Erythema or Congestion. Size/Enlargement- Bilateral- No enlargement. Discharge- bilateral-None.  Neck Neck- Supple. No Masses.   Chest and Lung Exam Auscultation: Breath Sounds:-Clear even and unlabored.  Cardiovascular Auscultation:Rythm- Regular, rate and rhythm. Murmurs & Other Heart Sounds:Ausculatation of the heart reveal- No Murmurs.  Lymphatic Head & Neck General Head & Neck Lymphatics: Bilateral: Description- No Localized lymphadenopathy.       Assessment & Plan:  You have the flu(clinically). I am treating you with tamiflu. Rest, hydrate and take tylenol for fever. Alternate ibuprofen as discussed  if necessary for body ache or fever.   You do appear to have sinus infection as well. Rx  azithromycin antibiotic. Flonase for nasal congestion. Benzonatate for cough.  Make sure you eat and stay hydrated(propel good choice to keep hydrated).  Follow up in 7 days or as needed  Pt flu test was neg but he has symptoms and partner had + flu test yesterday.

## 2016-10-05 NOTE — Progress Notes (Signed)
Pre visit review using our clinic review tool, if applicable. No additional management support is needed unless otherwise documented below in the visit note. 

## 2016-10-05 NOTE — Patient Instructions (Addendum)
You have the flu(clinically). I am treating you with tamiflu. Rest, hydrate and take tylenol for fever. Alternate ibuprofen as discussed  if necessary for body ache or fever.   You do appear to have sinus infection as well. Rx azithromycin antibiotic. Flonase for nasal congestion. Benzonatate for cough.  Make sure you eat and stay hydrated(propel good choice to keep hydrated).  Follow up in 7 days or as needed

## 2016-10-24 ENCOUNTER — Other Ambulatory Visit: Payer: Self-pay | Admitting: Family

## 2016-12-12 ENCOUNTER — Telehealth: Payer: Self-pay | Admitting: *Deleted

## 2016-12-12 NOTE — Telephone Encounter (Signed)
-----   Message from Debbrah Alar, NP sent at 08/14/2016  5:44 PM EST ----- Could we please contact [facility] to request records?

## 2016-12-12 NOTE — Telephone Encounter (Signed)
Spoke with medical records at [facility] and they report that they have never seen pt. Spoke with pt and he states he doesn't remember seeing a neurologist before. Please advise?

## 2016-12-13 NOTE — Telephone Encounter (Signed)
OK. Thanks.

## 2017-02-28 ENCOUNTER — Encounter (HOSPITAL_BASED_OUTPATIENT_CLINIC_OR_DEPARTMENT_OTHER): Payer: Self-pay | Admitting: *Deleted

## 2017-02-28 ENCOUNTER — Emergency Department (HOSPITAL_BASED_OUTPATIENT_CLINIC_OR_DEPARTMENT_OTHER)
Admission: EM | Admit: 2017-02-28 | Discharge: 2017-02-28 | Disposition: A | Discharge status: Home / Self Care | Payer: 59 | Attending: Physician Assistant | Admitting: Physician Assistant

## 2017-02-28 DIAGNOSIS — R200 Anesthesia of skin: Secondary | ICD-10-CM | POA: Diagnosis not present

## 2017-02-28 DIAGNOSIS — R42 Dizziness and giddiness: Secondary | ICD-10-CM | POA: Insufficient documentation

## 2017-02-28 DIAGNOSIS — F17210 Nicotine dependence, cigarettes, uncomplicated: Secondary | ICD-10-CM | POA: Insufficient documentation

## 2017-02-28 DIAGNOSIS — R51 Headache: Secondary | ICD-10-CM | POA: Diagnosis not present

## 2017-02-28 DIAGNOSIS — I10 Essential (primary) hypertension: Secondary | ICD-10-CM | POA: Insufficient documentation

## 2017-02-28 MED ORDER — ACETAMINOPHEN 325 MG PO TABS
650.0000 mg | ORAL_TABLET | Freq: Once | ORAL | Status: AC
Start: 2017-02-28 — End: 2017-02-28
  Administered 2017-02-28: 650 mg via ORAL
  Filled 2017-02-28: qty 2

## 2017-02-28 NOTE — Discharge Instructions (Signed)
Please read and follow all provided instructions.  Your diagnoses today include:  1. Episodic lightheadedness     Tests performed today include:  Vital signs. See below for your results today.   Medications:   None  Take any prescribed medications only as directed.  Additional information:  Follow any educational materials contained in this packet.  Follow-up instructions: Please follow-up with your primary care provider in the next 3 days for further evaluation of your symptoms.   Return instructions:   Please return to the Emergency Department if you experience worsening symptoms.  Return if the medications do not resolve your headache, if it recurs, or if you have multiple episodes of vomiting or cannot keep down fluids.  Return if you have a change from the usual headache.  RETURN IMMEDIATELY IF you:  Develop a sudden, severe headache  Develop confusion or become poorly responsive or faint  Develop a fever above 100.90F or problem breathing  Have a change in speech, vision, swallowing, or understanding  Develop new weakness, numbness, tingling, incoordination in your arms or legs  Have a seizure  Please return if you have any other emergent concerns.  Additional Information:  Your vital signs today were: BP 118/67 (BP Location: Right Arm)    Pulse 60    Temp 98.1 F (36.7 C) (Oral)    Resp 16    Ht 5\' 9"  (1.753 m)    Wt 98 kg (216 lb)    SpO2 98%    BMI 31.90 kg/m  If your blood pressure (BP) was elevated above 135/85 this visit, please have this repeated by your doctor within one month. --------------

## 2017-02-28 NOTE — ED Triage Notes (Signed)
Patient complaint of dizziness while standing x 1 hour ago, tingling on the left side of his arm, stiffness of the neck, headache.

## 2017-02-28 NOTE — ED Notes (Signed)
States," I have been having dizziness and H/A for years"

## 2017-02-28 NOTE — ED Provider Notes (Signed)
[facility] Provider Note   CSN: [id number] Arrival date & time: 02/28/17  1433     History   Chief Complaint Chief Complaint  Patient presents with  . Dizziness    HPI Alexander Hendricks is a 51 y.o. male.  Patient presents with complaint of dizziness and headache. He states that he has had this ongoing for years and has had workup for this including an MRI of the brain (2016 at [facility]), evaluation by [employer]. 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 He also had a headache and stiffness in his neck. He went and sat down in a cool area and began to feel better. No full syncope. Patient denies signs of stroke including: facial droop, slurred speech, aphasia, weakness/numbness in extremities, imbalance/trouble walking. He describes pins and needles sensation in his left arm but no sensation deficit. He had blurry vision but no loss of vision. Some coworkers encouraged him to go "get checked out" and his son drove him to the emergency department. No treatments prior to arrival. Patient states that this episode is very similar to episodes that he has had for years. States that he has been eating and drinking well. No associated chest pain or shortness of breath.       Past Medical History:  Diagnosis Date  . Benign prostatic hyperplasia   . Colon polyps    adenomatous  . Frequent headaches   . H. pylori infection   . HTN (hypertension) 12/28/2015  . Multiple gastric ulcers   . Schatzki's ring     Patient Active Problem List   Diagnosis Date Noted  . Migraine 08/14/2016  . Carpal tunnel syndrome 03/28/2016  . Bilateral knee pain 01/28/2016  . H. pylori duodenitis 01/24/2016  . Kidney lesion, native, left 01/24/2016  . Epigastric abdominal tenderness 12/28/2015  . Anxiety state 12/28/2015  . BPH (benign prostatic hyperplasia) 12/28/2015  . HTN (hypertension) 12/28/2015  . Benign prostatic  hyperplasia with urinary obstruction 02/22/2015  . Hypertriglyceridemia 01/08/2015  . Primary osteoarthritis of both knees 12/28/2014  . Current tobacco use 12/25/2014  . Hypercholesterolemia 12/25/2014  . H/O gastric ulcer 12/25/2014  . Acid reflux 12/25/2014  . AA (alcohol abuse) 12/25/2014  . Elevated prostate specific antigen (PSA) 12/18/2013    History reviewed. No pertinent surgical history.     Home Medications    Prior to Admission medications   Medication Sig Start Date End Date Taking? Authorizing Provider  amLODipine (NORVASC) 5 MG tablet Take 1 tablet (5 mg total) by mouth daily. 10/05/16   Saguier, Percell Miller, PA-C  azithromycin (ZITHROMAX) 250 MG tablet Take 2 tablets by mouth on day 1, followed by 1 tablet by mouth daily for 4 days. 10/05/16   Saguier, Percell Miller, PA-C  benzonatate (TESSALON) 100 MG capsule Take 1 capsule (100 mg total) by mouth 3 (three) times daily as needed for cough. 10/05/16   Saguier, Percell Miller, PA-C  escitalopram (LEXAPRO) 10 MG tablet Take 1 tablet (10 mg total) by mouth daily. 08/14/16   Debbrah Alar, NP  finasteride (PROSCAR) 5 MG tablet TAKE ONE TABLET BY MOUTH ONCE DAILY 10/24/16   Debbrah Alar, NP  fluticasone (FLONASE) 50 MCG/ACT nasal spray Place 2 sprays into both nostrils daily. 10/05/16   Saguier, Percell Miller, PA-C  lisinopril (PRINIVIL,ZESTRIL) 40 MG tablet Take 1 tablet (40 mg total) by mouth daily. 08/14/16   Debbrah Alar, NP  oseltamivir (TAMIFLU) 75 MG capsule Take 1 capsule (75 mg  total) by mouth 2 (two) times daily. 10/05/16   Saguier, Percell Miller, PA-C  ranitidine (ZANTAC) 150 MG capsule Take 1 capsule (150 mg total) by mouth every evening. Reported on 01/03/2016 08/14/16   Debbrah Alar, NP  SUMAtriptan (IMITREX) 50 MG tablet 1 tab at start of migraine, may repeat in 2 hours. Max 2 tabs in 24 hours 08/14/16   Debbrah Alar, NP    Family History Family History  Problem Relation Age of Onset  . Cancer Mother         colon  . Rheum arthritis Mother   . Cancer Father        leukemia  . Alzheimer's disease Maternal Grandfather     Social History Social History  Substance Use Topics  . Smoking status: Current Every Day Smoker    Packs/day: 0.50    Years: 35.00    Types: Cigarettes  . Smokeless tobacco: Never Used  . Alcohol use No     Comment: occassional     Allergies   Patient has no known allergies.   Review of Systems Review of Systems  Constitutional: Negative for fever.  HENT: Negative for congestion, dental problem, rhinorrhea and sinus pressure.   Eyes: Negative for photophobia, discharge, redness and visual disturbance.  Respiratory: Negative for shortness of breath.   Cardiovascular: Negative for chest pain.  Gastrointestinal: Negative for nausea and vomiting.  Musculoskeletal: Negative for gait problem, neck pain and neck stiffness.  Skin: Negative for rash.  Neurological: Positive for light-headedness, numbness (no deficit, just tingling) and headaches. Negative for syncope, speech difficulty and weakness.  Psychiatric/Behavioral: Negative for confusion.     Physical Exam Updated Vital Signs BP 127/84   Pulse 64   Temp 98.1 F (36.7 C) (Oral)   Resp 18   Ht 5\' 9"  (1.753 m)   Wt 98 kg (216 lb)   SpO2 96%   BMI 31.90 kg/m   Physical Exam  Constitutional: He is oriented to person, place, and time. He appears well-developed and well-nourished.  HENT:  Head: Normocephalic and atraumatic.  Right Ear: Tympanic membrane, external ear and ear canal normal.  Left Ear: Tympanic membrane, external ear and ear canal normal.  Nose: Nose normal.  Mouth/Throat: Uvula is midline, oropharynx is clear and moist and mucous membranes are normal.  TMs normal.  Eyes: Conjunctivae, EOM and lids are normal. Pupils are equal, round, and reactive to light. Right eye exhibits no discharge. Left eye exhibits no discharge.  Neck: Normal range of motion. Neck supple.  No meningeal signs    Cardiovascular: Normal rate, regular rhythm and normal heart sounds.   No murmur heard. Pulmonary/Chest: Effort normal and breath sounds normal.  Abdominal: Soft. There is no tenderness. There is no rebound and no guarding.  Musculoskeletal: Normal range of motion.       Cervical back: He exhibits normal range of motion, no tenderness and no bony tenderness.  Neurological: He is alert and oriented to person, place, and time. He has normal strength and normal reflexes. No cranial nerve deficit or sensory deficit. He exhibits normal muscle tone. He displays a negative Romberg sign. Coordination and gait normal. GCS eye subscore is 4. GCS verbal subscore is 5. GCS motor subscore is 6.  Patient sits up and ambulates without any difficulty.  Skin: Skin is warm and dry.  Psychiatric: He has a normal mood and affect.  Nursing note and vitals reviewed.    ED Treatments / Results  Labs (all labs ordered are listed,  but only abnormal results are displayed) Labs Reviewed - No data to display  EKG  EKG Interpretation  Date/Time:  Wednesday Feb 28 2017 14:41:06 EDT Ventricular Rate:  65 PR Interval:    QRS Duration: 89 QT Interval:  381 QTC Calculation: 397 R Axis:   -11 Text Interpretation:  Sinus rhythm Borderline T abnormalities, inferior leads ST elev, probable normal early repol pattern No significant change since last tracing Confirmed by Blanchie Dessert [phone]) on 02/28/2017 2:43:52 PM        Procedures Procedures (including critical care time)  Medications Ordered in ED Medications  acetaminophen (TYLENOL) tablet 650 mg (650 mg Oral Given 02/28/17 1518)     Initial Impression / Assessment and Plan / ED Course  I have reviewed the triage vital signs and the nursing notes.  Pertinent labs & imaging results that were available during my care of the patient were reviewed by me and considered in my medical decision making (see chart for details).     Patient seen and  examined. I reviewed previous ED records, PCP, and specialist notes. EKG reviewed.  Will check orthostatics. Full neuro exam is unremarkable. No motor or sensory deficits here. Patient ambulates well without difficulty. I feel he would be best served at this time by follow-up with PCP and continued outpatient workup and management of his chronic dizzy spells. In the interim, he should return with any worsening neurologic symptoms. Patient counseled to return if they have weakness in their arms or legs, slurred speech, trouble walking or talking, confusion, trouble with their balance, or if they have any other concerns. Patient verbalizes understanding and agrees with plan.    Vital signs reviewed and are as follows: BP 127/84   Pulse 64   Temp 98.1 F (36.7 C) (Oral)   Resp 18   Ht 5\' 9"  (1.753 m)   Wt 98 kg (216 lb)   SpO2 96%   BMI 31.90 kg/m     Final Clinical Impressions(s) / ED Diagnoses   Final diagnoses:  Episodic lightheadedness   Patient with ongoing dizziness/lightheaded spells with associated headaches, for years. Today's episode was similar to previous. No symptoms at time of exam. Patient has PCP and neurology follow-up. I have a very low suspicion for TIA at this time. No CP to suggest cardiac etiology, presentation not suspicious for PE. Feel patient is best served by continued PCP f/u than large work-up in ED today that I suspect will continue to be unrevealing. Pt agrees, and agrees to return with worsening symptoms.   New Prescriptions Discharge Medication List as of 02/28/2017  4:02 PM       Carlisle Cater, PA-C 02/28/17 1627    Mackuen, Fredia Sorrow, MD 02/28/17 [phone]

## 2017-03-09 ENCOUNTER — Telehealth: Payer: Self-pay | Admitting: Family

## 2017-03-09 ENCOUNTER — Other Ambulatory Visit: Payer: Self-pay | Admitting: Family

## 2017-03-09 MED ORDER — ESCITALOPRAM OXALATE 10 MG PO TABS: 10.0000 mg | ORAL_TABLET | Freq: Every day | ORAL | 0 refills | Status: DC

## 2017-03-09 NOTE — Telephone Encounter (Signed)
Last lexapro refill was 08/2016. Pt last seen by PCP 08/2016 and advised follow up in February.

## 2017-03-09 NOTE — Telephone Encounter (Signed)
Caller name: Relationship to patient: Self Can be reached: [phone]  Pharmacy:  [facility], [place] [phone] (Phone) [phone] (Fax)     Reason for call: Refill escitalopram (LEXAPRO) 10 MG tablet [[id number]]

## 2017-03-09 NOTE — Telephone Encounter (Signed)
Pt last seen by PCP 08/2016 and advised 3 month follow up. Pt is past due. 2 week supply sent to pharmacy. Please call pt to schedule appt with Melissa soon. Thanks!

## 2017-03-12 ENCOUNTER — Telehealth: Payer: Self-pay | Admitting: Family

## 2017-03-12 DIAGNOSIS — N289 Disorder of kidney and ureter, unspecified: Secondary | ICD-10-CM

## 2017-03-12 NOTE — Telephone Encounter (Signed)
Please contact patient and let him know that our records show that he is due for a follow up MRI to follow the spot we are watching on his kidney. Order pended.

## 2017-03-13 NOTE — Telephone Encounter (Signed)
Notified pt and he is agreeable to proceed with MRI. Order signed.

## 2017-03-13 NOTE — Telephone Encounter (Signed)
Spoke with pt and scheduled f/u with Melissa on 03/16/17 at 3:45pm.

## 2017-03-14 ENCOUNTER — Telehealth: Payer: Self-pay | Admitting: Family

## 2017-03-14 DIAGNOSIS — I10 Essential (primary) hypertension: Secondary | ICD-10-CM

## 2017-03-14 NOTE — Telephone Encounter (Signed)
Notified pt of below. He is scheduled to see PCP on Friday at 3:45pm for routine follow up. Advised pt he should come by lab tomorrow as we want to make sure lab result is back before MRI on Saturday. Pt voices understanding and will return to lab tomorrow at 4pm. Lab appt scheduled and order entered. Are there any other labs pt may need for his follow up?  Pt states he took 2 ativan in the past prior to MRI  and it still didn't help. Please advise if any other alternative?

## 2017-03-14 NOTE — Telephone Encounter (Signed)
Please arrange bmet to be drawn before his Saturday mri. I am unable to give him an injection for his claustrophobia, but I will send a different anxiety medication.   Please call in rx for ativan 1 mg PO x 1 30 minutes prior to MRI #1 tablet.

## 2017-03-14 NOTE — Telephone Encounter (Signed)
My records show that he was given Valium last time.  I don't have anything stronger to offer. I would recommend that he close his eyes before entering MRI and not open them until complete.

## 2017-03-14 NOTE — Telephone Encounter (Signed)
-----   Message from [place] sent at 03/14/2017 10:33 AM EDT ----- Regarding:  MRI Hello Debbrah Alar,   The patient above did advise that he is claustrophobic and would like some medication prescribe before the appointment, he did advise that last time he received an injection because the medication wasn't strong enough. Also he will need an updated CMP or just a Bun/Creat before his MRI, he is scheduled for this Saturday at 4pm.   Thank you,  Destiny B. - [facility]

## 2017-03-15 ENCOUNTER — Other Ambulatory Visit (INDEPENDENT_AMBULATORY_CARE_PROVIDER_SITE_OTHER): Payer: 59

## 2017-03-15 DIAGNOSIS — I10 Essential (primary) hypertension: Secondary | ICD-10-CM | POA: Diagnosis not present

## 2017-03-16 ENCOUNTER — Encounter: Payer: Self-pay | Admitting: Family

## 2017-03-16 ENCOUNTER — Ambulatory Visit (INDEPENDENT_AMBULATORY_CARE_PROVIDER_SITE_OTHER): Payer: 59 | Admitting: Family

## 2017-03-16 VITALS — BP 113/80 | HR 85 | Temp 98.7°F | Resp 18 | Ht 69.0 in | Wt 210.6 lb

## 2017-03-16 DIAGNOSIS — R519 Headache, unspecified: Secondary | ICD-10-CM

## 2017-03-16 DIAGNOSIS — R51 Headache: Secondary | ICD-10-CM

## 2017-03-16 DIAGNOSIS — R5383 Other fatigue: Secondary | ICD-10-CM | POA: Diagnosis not present

## 2017-03-16 DIAGNOSIS — M2550 Pain in unspecified joint: Secondary | ICD-10-CM

## 2017-03-16 DIAGNOSIS — I10 Essential (primary) hypertension: Secondary | ICD-10-CM | POA: Diagnosis not present

## 2017-03-16 LAB — TSH: TSH: 0.56 mIU/L (ref 0.40–4.50)

## 2017-03-16 LAB — CBC WITH DIFFERENTIAL/PLATELET
Basophils Absolute: 0 cells/uL (ref 0–200)
Basophils Relative: 0 %
EOS PCT: 1 %
Eosinophils Absolute: 110 cells/uL (ref 15–500)
HCT: 40.1 % (ref 38.5–50.0)
HEMOGLOBIN: 13.5 g/dL (ref 13.2–17.1)
LYMPHS ABS: 2750 {cells}/uL (ref 850–3900)
Lymphocytes Relative: 25 %
MCH: 29.2 pg (ref 27.0–33.0)
MCHC: 33.7 g/dL (ref 32.0–36.0)
MCV: 86.8 fL (ref 80.0–100.0)
MONOS PCT: 3 %
MPV: 9.8 fL (ref 7.5–12.5)
Monocytes Absolute: 330 cells/uL (ref 200–950)
NEUTROS PCT: 71 %
Neutro Abs: 7810 cells/uL — ABNORMAL HIGH (ref 1500–7800)
PLATELETS: 246 10*3/uL (ref 140–400)
RBC: 4.62 MIL/uL (ref 4.20–5.80)
RDW: 14.9 % (ref 11.0–15.0)
WBC: 11 10*3/uL — AB (ref 3.8–10.8)

## 2017-03-16 LAB — BASIC METABOLIC PANEL
BUN: 16 mg/dL (ref 6–23)
CO2: 27 meq/L (ref 19–32)
Calcium: 9.7 mg/dL (ref 8.4–10.5)
Chloride: 107 mEq/L (ref 96–112)
Creatinine, Ser: 1.22 mg/dL (ref 0.40–1.50)
GFR: 80.68 mL/min (ref >60.00)
GLUCOSE: 79 mg/dL (ref 70–99)
POTASSIUM: 4 meq/L (ref 3.5–5.1)
SODIUM: 141 meq/L (ref 135–145)

## 2017-03-16 MED ORDER — LISINOPRIL 40 MG PO TABS: 40.0000 mg | ORAL_TABLET | Freq: Every day | ORAL | 5 refills | Status: DC

## 2017-03-16 MED ORDER — AMLODIPINE BESYLATE 5 MG PO TABS: 5.0000 mg | ORAL_TABLET | Freq: Every day | ORAL | 6 refills | Status: DC

## 2017-03-16 MED ORDER — ESCITALOPRAM OXALATE 10 MG PO TABS: 10.0000 mg | ORAL_TABLET | Freq: Every day | ORAL | 0 refills | Status: DC

## 2017-03-16 MED ORDER — FINASTERIDE 5 MG PO TABS: 5.0000 mg | ORAL_TABLET | Freq: Every day | ORAL | 5 refills | Status: DC

## 2017-03-16 NOTE — Addendum Note (Signed)
Addended by: Janalyn Rouse on: 03/16/2017 04:31 PM   Modules accepted: Orders

## 2017-03-16 NOTE — Patient Instructions (Signed)
Please complete lab work prior to leaving.   

## 2017-03-16 NOTE — Telephone Encounter (Signed)
Notified pt and he voices understanding. States he has tried this before and it did not help.

## 2017-03-16 NOTE — Progress Notes (Signed)
Subjective:    Patient ID: Alexander Hendricks, male    DOB: September 06, 1966, 51 y.o.   MRN: [id number]  HPI  Alexander Hendricks is a 51 yr old male who presents today for follow up.   He was seen in the ED on 02/28/17 with dizziness and headache.  Notes mild HA' s and dizziness but better than it was. He thinks he needs glasses.     HTN- reports good compliance with his medications.  BP Readings from Last 3 Encounters:  03/16/17 113/80  02/28/17 118/67  10/05/16 110/70   Reports + fatigue. Hard to get up and go to the work in the AM.  Reports that  He goes to bed at 11 PM. Wakes at 6 AM.  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  No air conditioning.  Runs paper through a machine.  Reports + joint pain, cousin has lupus.  Knees bother him.  Review of Systems See HPI  Past Medical History:  Diagnosis Date  . Benign prostatic hyperplasia   . Colon polyps    adenomatous  . Frequent headaches   . H. pylori infection   . HTN (hypertension) 12/28/2015  . Multiple gastric ulcers   . Schatzki's ring      Social History   Social History  . Marital status: Married    Spouse name: N/A  . Number of children: 4  . Years of education: N/A   Occupational History  . machine operator    Social History Main Topics  . Smoking status: Current Every Day Smoker    Packs/day: 0.50    Years: 35.00    Types: Cigarettes  . Smokeless tobacco: Never Used  . Alcohol use No     Comment: occassional  . Drug use: No  . Sexual activity: Not on file   Other Topics Concern  . Not on file   Social History Narrative   Fiance   61 yr old son lives with pt (from previous marriage)   2 sons and 1 daughter- grown and out of the house (all here in [place])  Has 4 grandchildren   Works as a Glass blower/designer.   Completed graduate school   Enjoys watching son's sports   No pets       No past surgical history on file.  Family History  Problem Relation Age of Onset  . Cancer Mother        colon  . Rheum arthritis Mother   . Cancer Father        leukemia  . Alzheimer's disease Maternal Grandfather     No Known Allergies  Current Outpatient Prescriptions on File Prior to Visit  Medication Sig Dispense Refill  . amLODipine (NORVASC) 5 MG tablet Take 1 tablet (5 mg total) by mouth daily. 30 tablet 6  . finasteride (PROSCAR) 5 MG tablet TAKE ONE TABLET BY MOUTH ONCE DAILY 30 tablet 5  . fluticasone (FLONASE) 50 MCG/ACT nasal spray Place 2 sprays into both nostrils daily. 16 g 1  . lisinopril (PRINIVIL,ZESTRIL) 40 MG tablet Take 1 tablet (40 mg total) by mouth daily. 30 tablet 5  . SUMAtriptan (IMITREX) 50 MG tablet 1 tab at start of migraine, may repeat in 2 hours. Max 2 tabs in 24 hours 10 tablet 3  . escitalopram (LEXAPRO) 10 MG tablet Take 1 tablet (10 mg total) by mouth daily. 14 tablet 0   No current facility-administered medications on file prior to visit.  BP 113/80 (BP Location: Right Arm, Cuff Size: Large)   Pulse 85   Temp 98.7 F (37.1 C) (Oral)   Resp 18   Ht '5\' 9"'  (1.753 m)   Wt 210 lb 9.6 oz (95.5 kg)   SpO2 98%   BMI 31.10 kg/m       Objective:   Physical Exam  Constitutional: He is oriented to person, place, and time. He appears well-developed and well-nourished. No distress.  HENT:  Head: Normocephalic and atraumatic.  Cardiovascular: Normal rate and regular rhythm.   No murmur heard. Pulmonary/Chest: Effort normal and breath sounds normal. No respiratory distress. He has no wheezes. He has no rales.  Musculoskeletal: He exhibits no edema.  Neurological: He is alert and oriented to person, place, and time.  Skin: Skin is warm and dry.  Psychiatric: He has a normal mood and affect. His behavior is normal. Thought content normal.          Assessment & Plan:  HTN- BP looks good, no orthostasis noted. Continue current meds.  Fatigue- check TSH, CBC. He sleeps 7 hrs/night, advised trying to get 8 hours of sleep.  Headache- advised  pt to see eye doctor for evaluation.  Joint pain- check ANA, ESR, RA

## 2017-03-17 ENCOUNTER — Ambulatory Visit (HOSPITAL_BASED_OUTPATIENT_CLINIC_OR_DEPARTMENT_OTHER): Payer: 59

## 2017-03-17 ENCOUNTER — Encounter: Payer: Self-pay | Admitting: Family

## 2017-03-17 LAB — SEDIMENTATION RATE: SED RATE: 4 mm/h (ref 0–15)

## 2017-03-19 ENCOUNTER — Other Ambulatory Visit: Payer: Self-pay | Admitting: Family

## 2017-03-19 LAB — ANA: ANA: NEGATIVE

## 2017-03-19 MED ORDER — LORAZEPAM 1 MG PO TABS: ORAL_TABLET | ORAL | 1 refills | Status: DC

## 2017-03-19 NOTE — Progress Notes (Signed)
See rx. 

## 2017-03-19 NOTE — Progress Notes (Signed)
Rx faxed to pharmacy  

## 2017-03-19 NOTE — Progress Notes (Signed)
Rx faxed and notified pt.

## 2017-03-20 ENCOUNTER — Encounter: Payer: Self-pay | Admitting: Family

## 2017-03-20 LAB — RHEUMATOID FACTOR

## 2017-03-24 ENCOUNTER — Ambulatory Visit (HOSPITAL_BASED_OUTPATIENT_CLINIC_OR_DEPARTMENT_OTHER)
Admission: RE | Admit: 2017-03-24 | Discharge: 2017-03-24 | Disposition: A | Discharge status: Home / Self Care | Payer: 59 | Source: Ambulatory Visit | Attending: Family | Admitting: Family

## 2017-03-24 DIAGNOSIS — N289 Disorder of kidney and ureter, unspecified: Secondary | ICD-10-CM | POA: Insufficient documentation

## 2017-03-24 DIAGNOSIS — N281 Cyst of kidney, acquired: Secondary | ICD-10-CM | POA: Diagnosis not present

## 2017-03-24 DIAGNOSIS — N2889 Other specified disorders of kidney and ureter: Secondary | ICD-10-CM | POA: Diagnosis not present

## 2017-03-24 MED ORDER — GADOBENATE DIMEGLUMINE 529 MG/ML IV SOLN
20.0000 mL | Freq: Once | INTRAVENOUS | Status: AC | PRN
  Administered 2017-03-24: 20 mL via INTRAVENOUS

## 2017-03-26 ENCOUNTER — Encounter: Payer: Self-pay | Admitting: Family

## 2017-03-26 DIAGNOSIS — N289 Disorder of kidney and ureter, unspecified: Secondary | ICD-10-CM

## 2017-03-26 HISTORY — DX: Disorder of kidney and ureter, unspecified: N28.9

## 2017-05-21 ENCOUNTER — Other Ambulatory Visit: Payer: Self-pay | Admitting: Medical

## 2017-05-23 ENCOUNTER — Other Ambulatory Visit: Payer: Self-pay | Admitting: *Deleted

## 2017-05-23 MED ORDER — AMLODIPINE BESYLATE 5 MG PO TABS: 5.0000 mg | ORAL_TABLET | Freq: Every day | ORAL | 1 refills | Status: DC

## 2017-05-23 NOTE — Progress Notes (Signed)
Faxed refill request received from [facility] for 90-day supply on Amlodipine 5mg  tablet. Refill sent per [facility] refill protocol/SLS 08/15

## 2017-05-24 ENCOUNTER — Ambulatory Visit (INDEPENDENT_AMBULATORY_CARE_PROVIDER_SITE_OTHER): Payer: 59 | Admitting: Family Medicine

## 2017-05-24 ENCOUNTER — Encounter: Payer: Self-pay | Admitting: Family Medicine

## 2017-05-24 VITALS — BP 96/62 | HR 72 | Temp 97.9°F | Ht 69.0 in | Wt 201.4 lb

## 2017-05-24 DIAGNOSIS — R42 Dizziness and giddiness: Secondary | ICD-10-CM

## 2017-05-24 DIAGNOSIS — R51 Headache: Secondary | ICD-10-CM | POA: Diagnosis not present

## 2017-05-24 DIAGNOSIS — R519 Headache, unspecified: Secondary | ICD-10-CM

## 2017-05-24 MED ORDER — NAPROXEN 500 MG PO TABS: 500.0000 mg | ORAL_TABLET | Freq: Two times a day (BID) | ORAL | 0 refills | Status: DC

## 2017-05-24 MED ORDER — KETOROLAC TROMETHAMINE 60 MG/2ML IM SOLN
60.0000 mg | Freq: Once | INTRAMUSCULAR | Status: AC
  Administered 2017-05-24: 60 mg via INTRAMUSCULAR

## 2017-05-24 NOTE — Progress Notes (Signed)
Chief Complaint  Patient presents with  . Headache    Patient is here today C/O severe headaches which have progressed in the last 3d.  Also had dizziness and explains that feels that his balance is off like he is going to fall over and feels that his vision is blurry.    Alexander Hendricks is a 51 y.o. male here for evaluation of headache.  Duration: 3 days Severity: 8 Quality: achy Associated symptoms: dizziness, blurred vision Denies: nausea, vomiting, sonophobia, scotomata, diplopia, tinnitus, neck stiffness, difficulty swallowing, trouble with speech Currently with headache? Yes  Past Medical History:  Diagnosis Date  . Benign prostatic hyperplasia   . Colon polyps    adenomatous  . Frequent headaches   . H. pylori infection   . HTN (hypertension) 12/28/2015  . Multiple gastric ulcers   . Renal lesion 03/26/2017   Stable, following with serial MRI's.   . Schatzki's ring    Family History  Problem Relation Age of Onset  . Cancer Mother        colon  . Rheum arthritis Mother   . Cancer Father        leukemia  . Alzheimer's disease Maternal Grandfather    Current Meds  Medication Sig  . amLODipine (NORVASC) 5 MG tablet Take 1 tablet (5 mg total) by mouth daily.  Marland Kitchen escitalopram (LEXAPRO) 10 MG tablet Take 1 tablet (10 mg total) by mouth daily.  . finasteride (PROSCAR) 5 MG tablet Take 1 tablet (5 mg total) by mouth daily.  Marland Kitchen lisinopril (PRINIVIL,ZESTRIL) 40 MG tablet Take 1 tablet (40 mg total) by mouth daily.    BP 96/62 (BP Location: Left Arm, Patient Position: Sitting, Cuff Size: Large)   Pulse 72   Temp 97.9 F (36.6 C) (Oral)   Ht 5\' 9"  (1.753 m)   Wt 201 lb 6.4 oz (91.4 kg)   SpO2 98%   BMI 29.74 kg/m  General: awake, alert, appearing stated age Eyes: PERRLA, EOMi Heart: RRR Lungs: CTAB, no accessory muscle use Neuro: CN 2-12 intact, no cerebellar signs, DTR's equal and symmetry, no clonus MSK: 5/5 strength throughout, normal gait, no TTP over posterior  cervical triangle or paraspinal cervical musculature Psych: Age appropriate judgment and insight, mood and affect normal  Acute nonintractable headache, unspecified headache type - Plan: ketorolac (TORADOL) injection 60 mg, naproxen (NAPROSYN) 500 MG tablet  Light headed  Orders as above. Start NSAID tomorrow. Tylenol. Heat. I would like him to hold amlodipine and cut lisinopril in half until he is seen, should his BP be affecting/causing his lightheadedness. Follow up in 2 weeks w Melissa to recheck BP. The patient voiced understanding and agreement to the plan.  [place], [place] 9:41 AM 05/24/17

## 2017-05-24 NOTE — Patient Instructions (Addendum)
Do not take Norvasc (amlodipine) until your follow up appointment.  Cut your Lisinopril in half and take daily. Do this until your follow up appt.  Please consider getting a home blood pressure monitor to check your blood pressure at home.   Do not take anti-inflammatories, including the medicine we are calling in, for the rest of the day.   OK to take Tylenol 1000 mg (2 extra strength tabs) or 975 mg (3 regular strength tabs) every 6 hours as needed.

## 2017-06-08 ENCOUNTER — Ambulatory Visit: Payer: 59 | Admitting: Family

## 2017-06-18 ENCOUNTER — Ambulatory Visit: Payer: 59 | Admitting: Family

## 2017-06-18 DIAGNOSIS — Z0289 Encounter for other administrative examinations: Secondary | ICD-10-CM

## 2017-07-03 IMAGING — CT CT ABD-PELV W/ CM
2 of 5 series · 16 of 46 positions shown, 18 images · IV contrast (APPLIED)
Comparison: 11/06/2011

CLINICAL DATA: Weight loss

EXAM:
CT ABDOMEN AND PELVIS WITH CONTRAST
TECHNIQUE: Multidetector CT imaging of the abdomen and pelvis was performed
using the standard protocol following bolus administration of
intravenous contrast.
CONTRAST:  100mL OMNIPAQUE IOHEXOL 300 MG/ML  SOLN

[Series 2: axial st · axial · 0.97mm/px · z∈[-660,-110]mm · 13 of 122 slices shown, 15 images]
[im 6/122  soft-tissue]
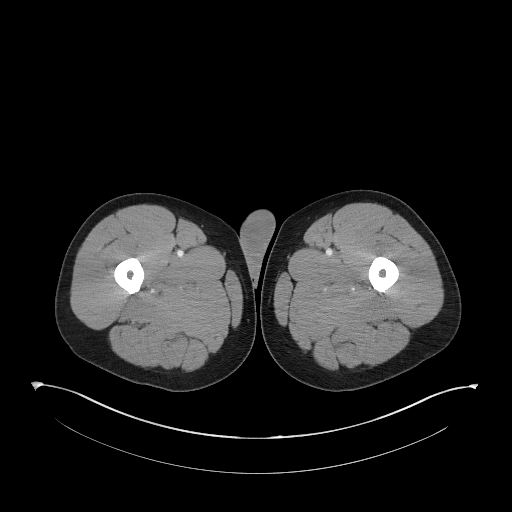
[im 6/122  bone]
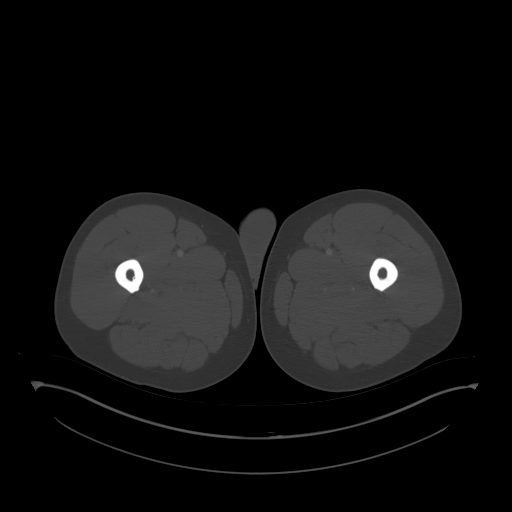
[im 18/122  soft-tissue]
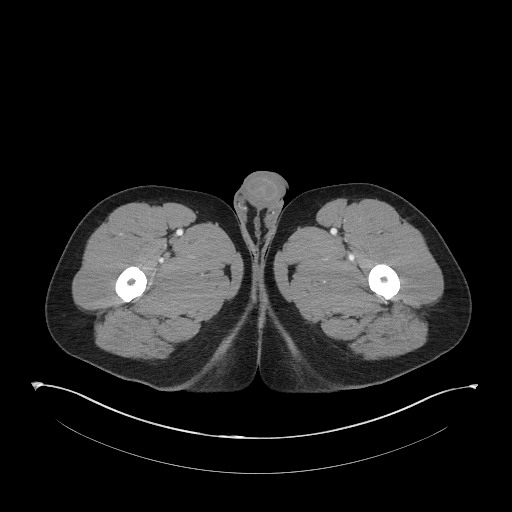
[im 24/122  soft-tissue]
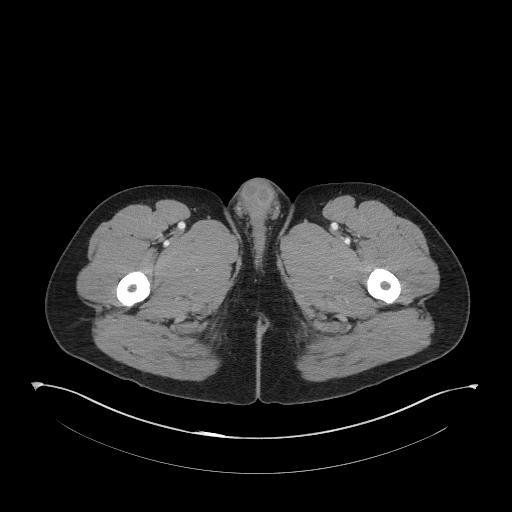
[im 35/122  soft-tissue]
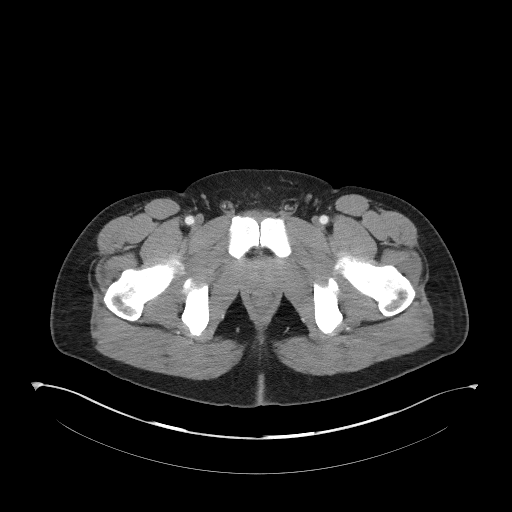
[im 41/122  soft-tissue]
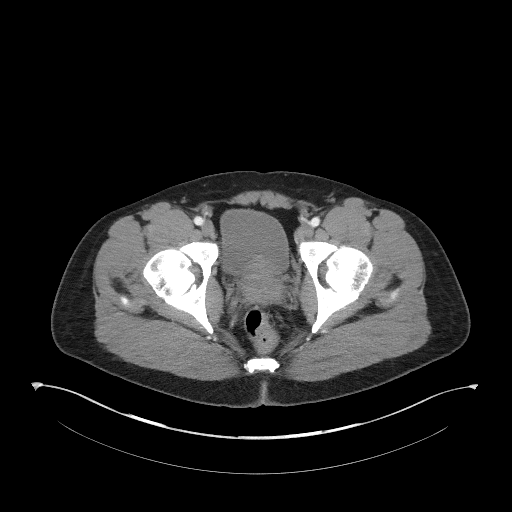
[im 52/122  soft-tissue]
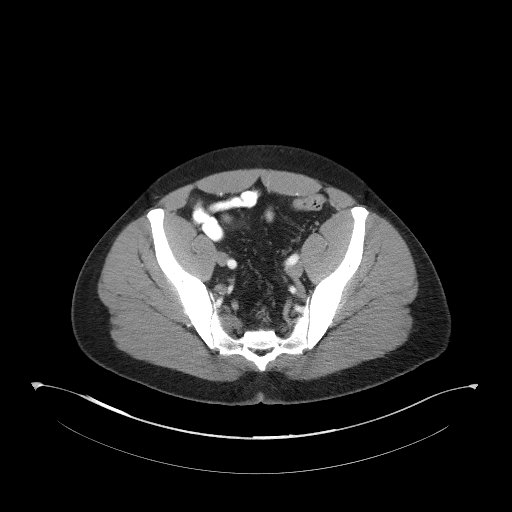
[im 64/122  soft-tissue]
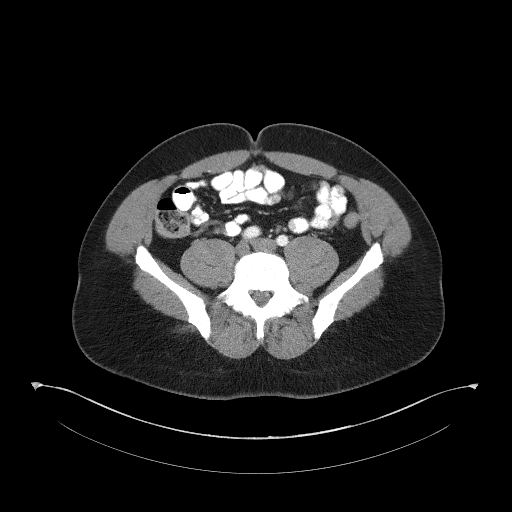
[im 70/122  soft-tissue]
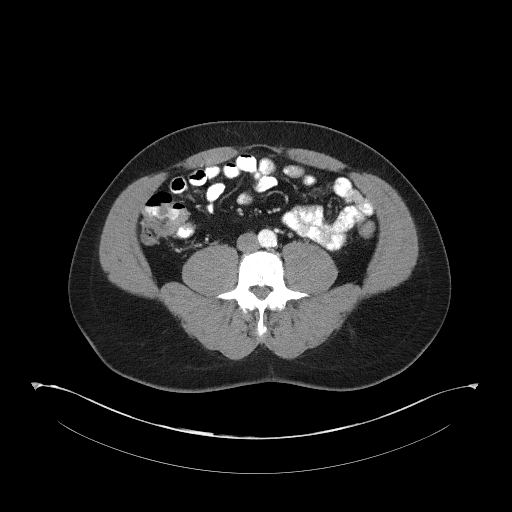
[im 81/122  soft-tissue]
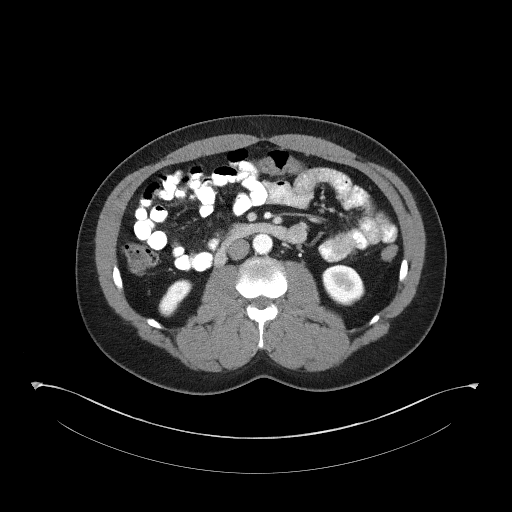
[im 81/122  bone]
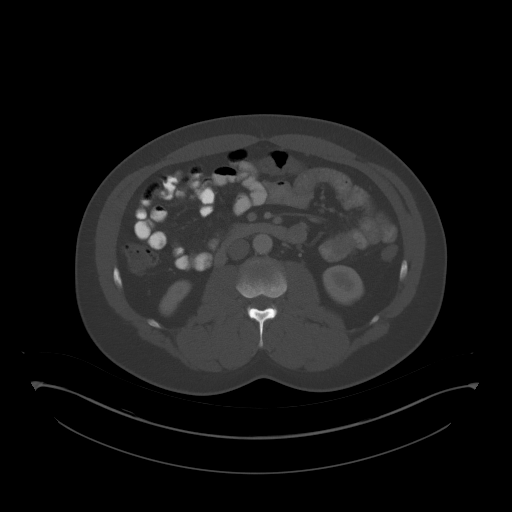
[im 87/122  soft-tissue]
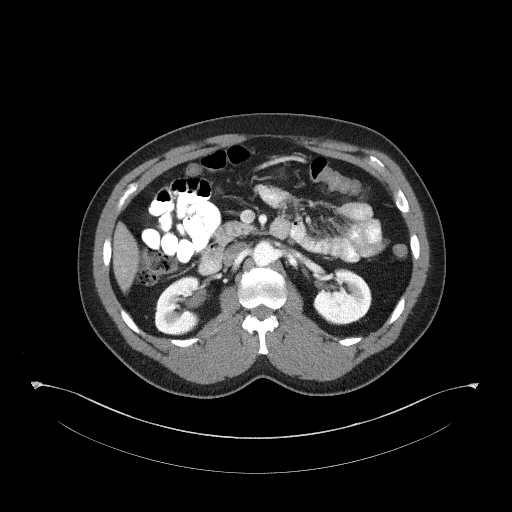
[im 98/122  soft-tissue]
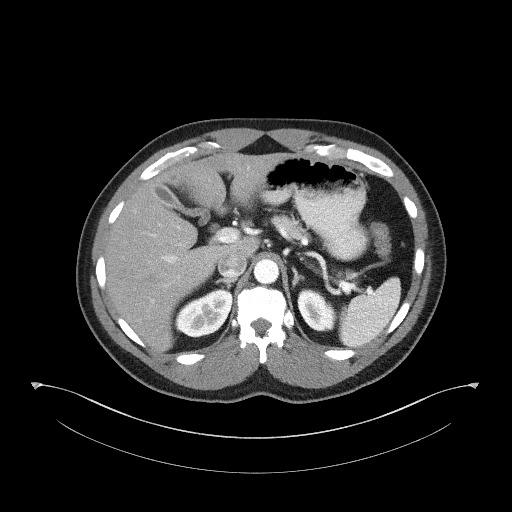
[im 104/122  soft-tissue]
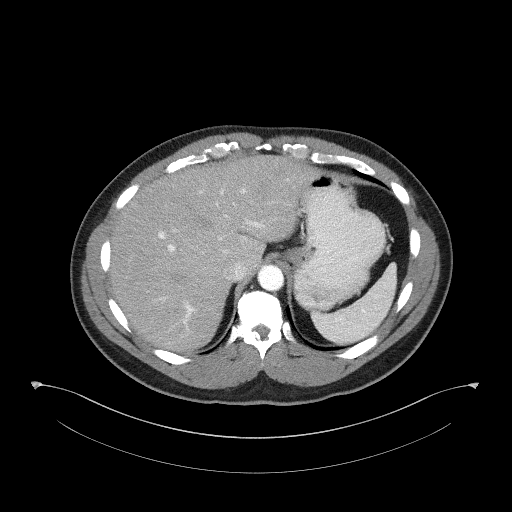
[im 116/122  soft-tissue]
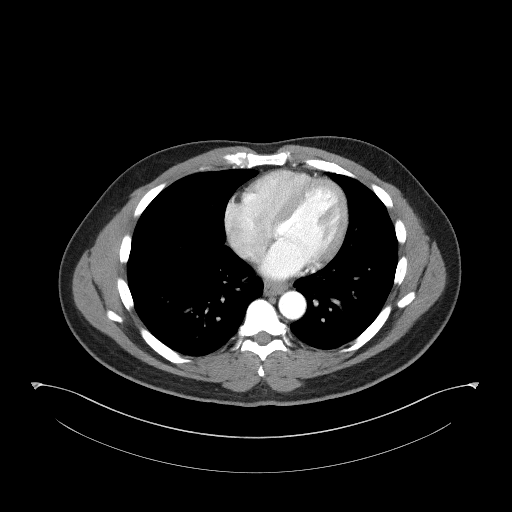

[Series 5: coronal st · coronal · 0.90mm/px · 3 of 108 slices shown]
[im 36/108  soft-tissue]
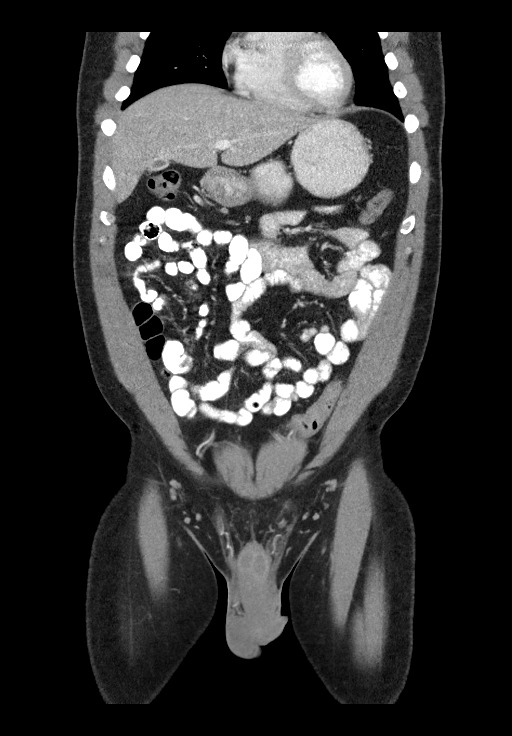
[im 48/108  soft-tissue]
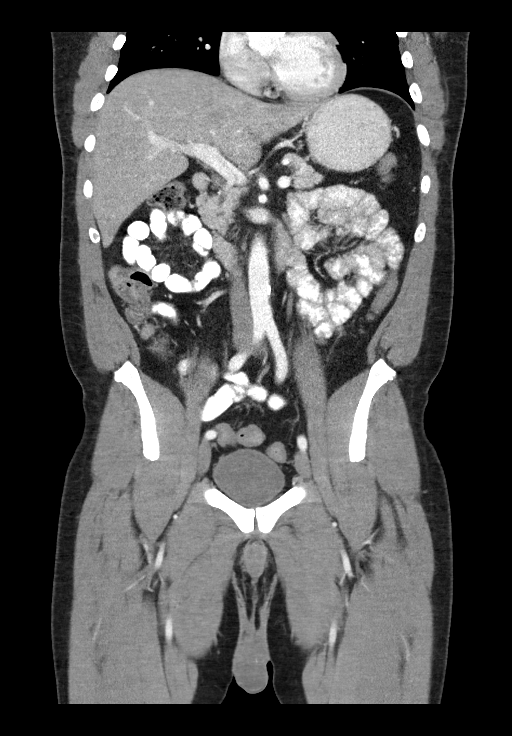
[im 60/108  soft-tissue]
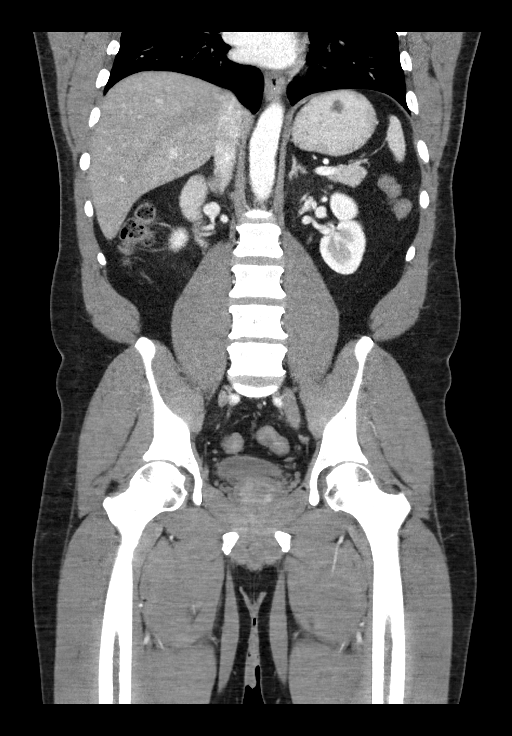

[16 of 46 positions shown; findings below may reference images not displayed]

FINDINGS: There is soft tissue infiltration throughout the cecum and lower
half of the ascending colon. This results then thickening of the
wall without focal mass. The terminal ileum is unaffected. There is
no evidence of bowel obstruction

Diffuse hepatic steatosis

Gallbladder is decompressed

Spleen, pancreas, adrenal glands are within normal limits.

Right kidney is unremarkable. There is an indeterminate 7 mm
hypodensity in the upper pole of the left kidney on image 30 of
series 2. In the lower pole, there is a simple cyst.

Bladder is within normal limits.  Prostate is upper normal in size

Sigmoid colon is within normal limits and is decompressed.

No free-fluid.  No abnormal retroperitoneal adenopathy.

No vertebral compression deformity. Lumbar degenerative disc disease
is present at L4-5 and L5-S1. There is facet arthropathy at L4-5.
IMPRESSION: There is soft tissue infiltration involving the wall of the cecum
and ascending colon. This may represent an inflammatory process,
however underlying neoplasm is not excluded. Endoscopy is
recommended.

Indeterminate 7 mm lesion is present in the left kidney. MRI with
and without contrast is recommended.

Degenerative changes in the lumbar spine.

## 2017-10-31 ENCOUNTER — Other Ambulatory Visit: Payer: Self-pay | Admitting: Family

## 2017-12-07 ENCOUNTER — Ambulatory Visit: Payer: 59 | Admitting: Family

## 2017-12-07 ENCOUNTER — Other Ambulatory Visit: Payer: Self-pay | Admitting: Family

## 2017-12-07 VITALS — BP 137/96 | HR 61 | Temp 98.2°F | Resp 16 | Ht 69.0 in | Wt 201.8 lb

## 2017-12-07 DIAGNOSIS — M25562 Pain in left knee: Secondary | ICD-10-CM | POA: Diagnosis not present

## 2017-12-07 DIAGNOSIS — I10 Essential (primary) hypertension: Secondary | ICD-10-CM | POA: Diagnosis not present

## 2017-12-07 DIAGNOSIS — K2970 Gastritis, unspecified, without bleeding: Secondary | ICD-10-CM | POA: Diagnosis not present

## 2017-12-07 DIAGNOSIS — M25561 Pain in right knee: Secondary | ICD-10-CM

## 2017-12-07 DIAGNOSIS — K219 Gastro-esophageal reflux disease without esophagitis: Secondary | ICD-10-CM

## 2017-12-07 LAB — COMPREHENSIVE METABOLIC PANEL
ALBUMIN: 3.9 g/dL (ref 3.5–5.2)
ALK PHOS: 55 U/L (ref 39–117)
ALT: 13 U/L (ref 0–53)
AST: 13 U/L (ref 0–37)
BUN: 11 mg/dL (ref 6–23)
CALCIUM: 9.3 mg/dL (ref 8.4–10.5)
CO2: 31 mEq/L (ref 19–32)
Chloride: 105 mEq/L (ref 96–112)
Creatinine, Ser: 1.04 mg/dL (ref 0.40–1.50)
GFR: 96.72 mL/min (ref >60.00)
Glucose, Bld: 88 mg/dL (ref 70–99)
POTASSIUM: 3.8 meq/L (ref 3.5–5.1)
Sodium: 140 mEq/L (ref 135–145)
TOTAL PROTEIN: 7.2 g/dL (ref 6.0–8.3)
Total Bilirubin: 0.6 mg/dL (ref 0.2–1.2)

## 2017-12-07 LAB — LIPASE: LIPASE: 20 U/L (ref 11.0–59.0)

## 2017-12-07 MED ORDER — AMLODIPINE BESYLATE 5 MG PO TABS: 5.0000 mg | ORAL_TABLET | Freq: Every day | ORAL | 1 refills | Status: DC

## 2017-12-07 MED ORDER — PANTOPRAZOLE SODIUM 40 MG PO TBEC: 40.0000 mg | DELAYED_RELEASE_TABLET | Freq: Every day | ORAL | 3 refills | Status: DC

## 2017-12-07 MED ORDER — ESCITALOPRAM OXALATE 10 MG PO TABS: 10.0000 mg | ORAL_TABLET | Freq: Every day | ORAL | 2 refills | Status: DC

## 2017-12-07 NOTE — Progress Notes (Signed)
Subjective:    Patient ID: Alexander Hendricks, male    DOB: [birth year], 52 y.o.   MRN: [id number]  HPI   Alexander Hendricks is a 52 yr old male who presents today for follow up.  HTN- maintained on amlodipne 5mg  and lisinopril 40mg .  BP Readings from Last 3 Encounters:  12/07/17 (!) 137/96  05/24/17 96/62  03/16/17 113/80   Has hx of H pylori.  Had Endo.  He is taking ranitidine.reports a "hunger feeling all the time."    He reports bilateral knee pain.would like referral back to sports medicine.  Review of Systems    see HPI  Past Medical History:  Diagnosis Date  . Benign prostatic hyperplasia   . Colon polyps    adenomatous  . Frequent headaches   . H. pylori infection   . HTN (hypertension) 12/28/2015  . Multiple gastric ulcers   . Renal lesion 03/26/2017   Stable, following with serial MRI's.   . Schatzki's ring      Social History   Socioeconomic History  . Marital status: Married    Spouse name: Not on file  . Number of children: 4  . Years of education: Not on file  . Highest education level: Not on file  Social Needs  . Financial resource strain: Not on file  . Food insecurity - worry: Not on file  . Food insecurity - inability: Not on file  . Transportation needs - medical: Not on file  . Transportation needs - non-medical: Not on file  Occupational History  . Occupation: Glass blower/designer  Tobacco Use  . Smoking status: Current Every Day Smoker    Packs/day: 0.50    Years: 35.00    Pack years: 17.50    Types: Cigarettes  . Smokeless tobacco: Never Used  Substance and Sexual Activity  . Alcohol use: No    Alcohol/week: 0.0 oz    Comment: occassional  . Drug use: No  . Sexual activity: Not on file  Other Topics Concern  . Not on file  Social History Narrative   Fiance   57 yr old son lives with pt (from previous marriage)   2 sons and 1 daughter- grown and out of the house (all here in [place])  Has 4 grandchildren   Works as a Glass blower/designer.   Completed graduate school   Enjoys watching son's sports   No pets    No past surgical history on file.  Family History  Problem Relation Age of Onset  . Cancer Mother        colon  . Rheum arthritis Mother   . Cancer Father        leukemia  . Alzheimer's disease Maternal Grandfather     No Known Allergies  Current Outpatient Medications on File Prior to Visit  Medication Sig Dispense Refill  . finasteride (PROSCAR) 5 MG tablet Take 1 tablet (5 mg total) by mouth daily. 30 tablet 5  . lisinopril (PRINIVIL,ZESTRIL) 40 MG tablet TAKE 1 TABLET BY MOUTH ONCE DAILY 30 tablet 0   No current facility-administered medications on file prior to visit.     BP (!) 137/96 (BP Location: Right Arm, Patient Position: Sitting, Cuff Size: Large)   Pulse 61   Temp 98.2 F (36.8 C) (Oral)   Resp 16   Ht 5\' 9"  (1.753 m)   Wt 201 lb 12.8 oz (91.5 kg)   SpO2 100%   BMI 29.80 kg/m    Objective:   Physical  Exam  Constitutional: He is oriented to person, place, and time. He appears well-developed and well-nourished. No distress.  HENT:  Head: Normocephalic and atraumatic.  Cardiovascular: Normal rate and regular rhythm.  No murmur heard. Pulmonary/Chest: Effort normal and breath sounds normal. No respiratory distress. He has no wheezes. He has no rales.  Abdominal: Soft. Bowel sounds are normal. He exhibits no distension. There is no tenderness. There is no rebound.  Musculoskeletal: He exhibits no edema.  Neurological: He is alert and oriented to person, place, and time.  Skin: Skin is warm and dry.  Psychiatric: He has a normal mood and affect. His behavior is normal. Thought content normal.          Assessment & Plan:  bilat knee pain- refer back to sports medicine.   HTN- DBP mildly elevated today. Not taking amlodipine, restart amlodipine.  GERD/hx of gastritis-Denies nsaid use.  uncontrolled. D/c ranitidine, start protonix. Check H pylori breath test. Refer back to GI.

## 2017-12-07 NOTE — Patient Instructions (Signed)
Stop ranitidine, start protonix. Restart amlodipine. You will be contacted about your referral to GI and sports medicine.

## 2017-12-07 NOTE — Progress Notes (Deleted)
M

## 2017-12-10 LAB — H. PYLORI BREATH TEST: H. PYLORI BREATH TEST: DETECTED — AB

## 2017-12-11 ENCOUNTER — Telehealth: Payer: Self-pay | Admitting: Family

## 2017-12-12 ENCOUNTER — Ambulatory Visit: Payer: 59 | Admitting: Family Medicine

## 2017-12-12 MED ORDER — BIS SUBCIT-METRONID-TETRACYC 140-125-125 MG PO CAPS: 3.0000 | ORAL_CAPSULE | Freq: Three times a day (TID) | ORAL | 0 refills | Status: DC

## 2017-12-12 NOTE — Telephone Encounter (Signed)
Please contact patient and let him know that his breath test confirms H. pylori infection.  I would like for him to start Pylera (3  tablets 4 times daily).  I would recommend that he increase his Protonix to twice daily for 14 days then return to once daily.  Call if symptoms worsen or if they fail to improve.

## 2017-12-12 NOTE — Telephone Encounter (Signed)
Left detailed message on pt's voicemail and to call back if any concerns.

## 2017-12-14 ENCOUNTER — Other Ambulatory Visit: Payer: Self-pay | Admitting: *Deleted

## 2017-12-14 MED ORDER — BIS SUBCIT-METRONID-TETRACYC 140-125-125 MG PO CAPS: 3.0000 | ORAL_CAPSULE | Freq: Three times a day (TID) | ORAL | 0 refills | Status: DC

## 2017-12-14 NOTE — Telephone Encounter (Signed)
rx for h. Pylori sent to wrong pharmacy.  rx sent to [facility]

## 2017-12-14 NOTE — Telephone Encounter (Signed)
rx sent to correct pharmacy

## 2017-12-14 NOTE — Telephone Encounter (Signed)
Please send the medication to [facility]. It was sent to [facility] in [place].

## 2017-12-15 ENCOUNTER — Telehealth: Payer: Self-pay | Admitting: Family Medicine

## 2017-12-15 MED ORDER — METRONIDAZOLE 250 MG PO TABS
250.0000 mg | ORAL_TABLET | Freq: Three times a day (TID) | ORAL | 0 refills | Status: DC
Start: 2017-12-15 — End: 2017-12-16

## 2017-12-15 MED ORDER — BISMUTH SUBSALICYLATE POWD: 300.0000 mg | Freq: Three times a day (TID) | 0 refills | Status: DC

## 2017-12-15 MED ORDER — TETRACYCLINE HCL 500 MG PO CAPS: 500.0000 mg | ORAL_CAPSULE | Freq: Three times a day (TID) | ORAL | 0 refills | Status: DC

## 2017-12-15 NOTE — Telephone Encounter (Signed)
Entered in delay.  Received phone call from team health nurse.  Most recent office visit and H. pylori breath test reviewed.  Patient went to the pharmacy and the Pylera that was sent in was too expensive even after insurance.  It would cost $85.  I advised the nurse that I could send the individual components in though I could not promise these would be any cheaper and if they were not he would need to wait until Monday to discuss with his PCP.  I will send in enough to cover until Monday and forward a message to his PCP to extend the prescriptions as she sees fit to treat his H. pylori.

## 2017-12-16 ENCOUNTER — Telehealth: Payer: Self-pay | Admitting: Family Medicine

## 2017-12-16 MED ORDER — BISMUTH SUBSALICYLATE POWD
300.0000 mg | Freq: Three times a day (TID) | 0 refills | Status: DC
Start: 2017-12-16 — End: 2017-12-17

## 2017-12-16 MED ORDER — METRONIDAZOLE 250 MG PO TABS: 250.0000 mg | ORAL_TABLET | Freq: Three times a day (TID) | ORAL | 0 refills | Status: DC

## 2017-12-16 MED ORDER — TETRACYCLINE HCL 500 MG PO CAPS: 500.0000 mg | ORAL_CAPSULE | Freq: Three times a day (TID) | ORAL | 0 refills | Status: DC

## 2017-12-16 NOTE — Telephone Encounter (Signed)
Team health RN called again to inform that the regimen that was sent in yesterday was expensive as well with the bismuth being $100. She also stated that he noted he was not on any antibiotics. I advised that metronidazole and tetracycline were sent to the pharmacy. I advised the RN to let the patient and pharmacy know that I will defer to the patients PCP on Monday with regards to determining the best regimen. She will let the patient and the pharmacy know to hold off on taking the tetracycline and metronidazole until the regimen is determined by the patients PCP. Forwarding to patients PCP.

## 2017-12-16 NOTE — Telephone Encounter (Signed)
I sent an additional 7 day supply to complete 10 days in total.

## 2017-12-16 NOTE — Addendum Note (Signed)
Addended by: Debbrah Alar on: 12/16/2017 01:15 PM   Modules accepted: Orders

## 2017-12-17 ENCOUNTER — Telehealth: Payer: Self-pay | Admitting: Family

## 2017-12-17 MED ORDER — PANTOPRAZOLE SODIUM 40 MG PO TBEC: 40.0000 mg | DELAYED_RELEASE_TABLET | Freq: Two times a day (BID) | ORAL | 0 refills | Status: DC

## 2017-12-17 MED ORDER — CLARITHROMYCIN 500 MG PO TABS: 500.0000 mg | ORAL_TABLET | Freq: Two times a day (BID) | ORAL | 0 refills | Status: DC

## 2017-12-17 MED ORDER — AMOXICILLIN 500 MG PO CAPS: 1000.0000 mg | ORAL_CAPSULE | Freq: Two times a day (BID) | ORAL | 0 refills | Status: AC

## 2017-12-17 NOTE — Telephone Encounter (Signed)
Noted, see phone note. 

## 2017-12-17 NOTE — Telephone Encounter (Signed)
Notified pt of below. He states medication is too expensive but I do not believe he has spoke with pharmacy about cheaper alternatives that were sent this morning. Spoke with pharmacist and verified alternatives sent today are much cheaper. Left detailed message for pt to check with his pharmacy this afternoon and call us if any further questions.

## 2017-12-17 NOTE — Telephone Encounter (Signed)
Please contact pharmacy and cancel all orders for:  Bismuth subsalicylate, metronidazole, tetracycline.  I have sent instead: clarithromycin 500mg  bid and amox 1gm bid- both for 14 days. He should take protonix twice daily for 14 days as well.  Let me know if he has any trouble paying for this regimen.

## 2017-12-17 NOTE — Telephone Encounter (Signed)
Notified pt of below. He states medication is too expensive but I do not believe he has spoke with pharmacy about cheaper alternatives that were sent this morning. Spoke with pharmacist and verified alternatives sent today are much cheaper. Left detailed message for pt to check with his pharmacy this afternoon and call us if any further questions. Pharmacy made aware.

## 2017-12-17 NOTE — Telephone Encounter (Signed)
Please advise 

## 2017-12-17 NOTE — Telephone Encounter (Signed)
Copied from [place]. Topic: Quick Communication - See Telephone Encounter >> Dec 17, 2017  9:03 AM Lolita Rieger, RMA wrote: CRM for notification. See Telephone encounter for:   12/17/17.pt called and stated that the medication prescribed pylera is too much and he like something else sent to  [address]

## 2017-12-20 NOTE — Telephone Encounter (Signed)
Please advise 

## 2017-12-20 NOTE — Telephone Encounter (Signed)
Little bit confused on which medications caused him to have diarrhea, vomiting and cold sweats.  Is he referring to amoxicillin and clarithromycin along with PPI?  And other regimen is too expensive.?  Is Melissa working tomorrow? Would you send this update to her?  Finding regimen that he could tolerate might be difficult.  The amoxicillin is probably not bothering him but the clarithromycin probably is causing the side effects.  Let me know if Lenna Sciara is not here tomorrow.

## 2017-12-20 NOTE — Telephone Encounter (Signed)
Pt states these meds that were called in did not agree with him.  Pt states he only took one dose, on Tuesday night, 3/12 and it caused him to have diarrhea ,vomiting,hot and cold sweats. Pt states he is finally feeling better today,  Pt wanted to let the dr know this information.

## 2017-12-21 MED ORDER — LEVOFLOXACIN 500 MG PO TABS: 500.0000 mg | ORAL_TABLET | Freq: Every day | ORAL | 0 refills | Status: DC

## 2017-12-21 NOTE — Telephone Encounter (Signed)
Patient is calling to follow up on this. He states he would like a call back. He states he is needing a note out of work since he missed March13-15. Please advise.   [phone]

## 2017-12-21 NOTE — Addendum Note (Signed)
Addended by: Debbrah Alar on: 12/21/2017 10:04 AM   Modules accepted: Orders

## 2017-12-21 NOTE — Telephone Encounter (Signed)
Lets have him d/c clarithromycin, continue amoxicillin. Add levaquin x 10 days.  Call if recurrent GI symptoms.

## 2017-12-21 NOTE — Telephone Encounter (Signed)
Notified pt and he voices understanding. Per verbal from PCP, ok to provide requested work note. Advised pt letter has been placed up front and he can pick it up today until 5pm.

## 2018-01-07 ENCOUNTER — Ambulatory Visit: Payer: 59 | Admitting: Family

## 2018-01-14 ENCOUNTER — Encounter: Payer: Self-pay | Admitting: Family

## 2018-02-09 ENCOUNTER — Other Ambulatory Visit: Payer: Self-pay | Admitting: Family

## 2018-02-11 DIAGNOSIS — G44219 Episodic tension-type headache, not intractable: Secondary | ICD-10-CM | POA: Diagnosis not present

## 2018-02-11 DIAGNOSIS — H40033 Anatomical narrow angle, bilateral: Secondary | ICD-10-CM | POA: Diagnosis not present

## 2018-02-11 NOTE — Telephone Encounter (Signed)
30 day supply of proscar sent to pharmacy. Pt was due for 1 month follow up of blood pressure in April and he is past due. Please call pt to schedule appt with Melissa soon. Thanks!

## 2018-02-11 NOTE — Telephone Encounter (Signed)
Called pt and let him know that a 30 day supply was sent but he was due for a follow up before any further refills could be sent. Scheduled an appt for 02/18/18.

## 2018-02-18 ENCOUNTER — Ambulatory Visit: Payer: 59 | Admitting: Family

## 2018-02-18 DIAGNOSIS — Z0289 Encounter for other administrative examinations: Secondary | ICD-10-CM

## 2018-02-19 ENCOUNTER — Encounter: Payer: Self-pay | Admitting: Family

## 2018-02-20 ENCOUNTER — Ambulatory Visit: Payer: 59 | Admitting: Internal Medicine

## 2018-02-25 ENCOUNTER — Encounter: Payer: Self-pay | Admitting: Family

## 2018-04-27 ENCOUNTER — Telehealth: Payer: Self-pay | Admitting: Family

## 2018-04-29 NOTE — Telephone Encounter (Signed)
Called and spoke to the pt. I informed him that he was past due for a follow up for his medication refills. Pt stated that the pharmacy had called it in, but he had just received a 90 day supply. I advised that he would still need to be seen, as he was due to be seen in April. Pt stated that he was having financial troubles and is unable to pay his copay. I informed pt that he could be billed if he wanted. Pt stated that he would call back next week to schedule an appt.

## 2018-04-29 NOTE — Telephone Encounter (Signed)
30 day supply of amlodipine was sent to the pharmacy. Pt restarted medication in March and was advised to f/u in 4 weeks and is now past due. Please call pt to schedule appt with  Melissa before he needs further refills. Thanks!

## 2018-06-17 ENCOUNTER — Telehealth: Payer: Self-pay | Admitting: Family

## 2018-06-17 DIAGNOSIS — N281 Cyst of kidney, acquired: Secondary | ICD-10-CM

## 2018-06-17 NOTE — Telephone Encounter (Signed)
Please let pt know that our records show he is due for follow up MRI to evaluate his kidney cyst.  Order has been placed.

## 2018-06-17 NOTE — Telephone Encounter (Signed)
Attempted to reach pt and left detailed message on voicemail regarding below recommendation and to let us know if he has not been contacted to schedule appt within 1 week.

## 2018-06-21 ENCOUNTER — Telehealth: Payer: Self-pay | Admitting: *Deleted

## 2018-06-21 DIAGNOSIS — N281 Cyst of kidney, acquired: Secondary | ICD-10-CM

## 2018-06-21 NOTE — Telephone Encounter (Signed)
Received below message from imaging department: Pt will need a creatinine before he can have his MRI.  Also his mri has not been authorized yet.  Will check status of pre-cert with referral coordinator and call pt to schedule lab appt on Monday.

## 2018-06-24 NOTE — Telephone Encounter (Signed)
Patient is schedule to come in tomorrow for his lab appt

## 2018-06-24 NOTE — Telephone Encounter (Signed)
Received notice from referral co-ordinator that MRI pre-cert has been obtained. Attempted to reach pt and left detailed message that we need to schedule pt for lab appt to check kidney function prior to scheduling the MRI. [facility] [facility] / triage to discuss with pt and schedule lab appt when he returns my call. Future lab order has been placed.

## 2018-06-25 ENCOUNTER — Other Ambulatory Visit (INDEPENDENT_AMBULATORY_CARE_PROVIDER_SITE_OTHER): Payer: 59

## 2018-06-25 ENCOUNTER — Encounter: Payer: Self-pay | Admitting: Family

## 2018-06-25 DIAGNOSIS — N281 Cyst of kidney, acquired: Secondary | ICD-10-CM | POA: Diagnosis not present

## 2018-06-25 LAB — BASIC METABOLIC PANEL
BUN: 15 mg/dL (ref 6–23)
CHLORIDE: 107 meq/L (ref 96–112)
CO2: 27 meq/L (ref 19–32)
Calcium: 8.8 mg/dL (ref 8.4–10.5)
Creatinine, Ser: 1.13 mg/dL (ref 0.40–1.50)
GFR: 87.69 mL/min (ref >60.00)
Glucose, Bld: 91 mg/dL (ref 70–99)
POTASSIUM: 3.7 meq/L (ref 3.5–5.1)
Sodium: 142 mEq/L (ref 135–145)

## 2018-06-26 NOTE — Telephone Encounter (Signed)
Message sent to radiology to contact pt to schedule MRI.

## 2018-07-02 ENCOUNTER — Ambulatory Visit: Payer: Self-pay | Admitting: *Deleted

## 2018-07-02 NOTE — Telephone Encounter (Signed)
Pt calling initially to request a refill of Lisinopril but also stated that he was experiencing dizziness. Pt states that dizziness began today and he believes it is due not not taking his Lisinopril today.Pt is also on Amlodipine.Pt states that he is currently at work in a [employer] and does not get off until 6pm and he believes that his dizziness is due to his BP being to high. Pt states he has not taken his BP since this morning so he does not know what his BP is at this time. Pt advised that he would need to be seen in the office for current symptoms or to seek care at Urgent Care at least to get a BP reading. Pt states he was just in the office last week and he has a BP monitor at home. According to chart pt was seen to have lab work drawn last week on 06/25/18 and LOV on 12/07/17. Pt asking if he can just have his refill of Lisinopril at this time because he has experienced these symptoms before and does not have them with taking his medication. Pt states with rest, fluids and taking medication his symptoms improve. Advised pt that triage note will be forwarded to provider to see if refills of Lisinopril could be given without OV for current symptoms.  Reason for Disposition . Taking a medicine that could cause dizziness (e.g., blood pressure medications, diuretics)  Answer Assessment - Initial Assessment Questions 1. DESCRIPTION: "Describe your dizziness."     On and off all day the hotter I get the worse it gets 2. LIGHTHEADED: "Do you feel lightheaded?" (e.g., somewhat faint, woozy, weak upon standing) Not assessed  3. VERTIGO: "Do you feel like either you or the room is spinning or tilting?" (i.e. vertigo)     Not assessed 4. SEVERITY: "How bad is it?"  "Do you feel like you are going to faint?" "Can you stand and walk?"   - MILD - walking normally   - MODERATE - interferes with normal activities (e.g., work, school)    - SEVERE - unable to stand, requires support to walk, feels like passing  out now.      States he does not feel like he is going to faint 5. ONSET:  "When did the dizziness begin?"     Started today 6. AGGRAVATING FACTORS: "Does anything make it worse?" (e.g., standing, change in head position)     No 7. HEART RATE: "Can you tell me your heart rate?" "How many beats in 15 seconds?"  (Note: not all patients can do this)       Not assessed 8. CAUSE: "What do you think is causing the dizziness?"     Not taking Lisinopril this morning 9. RECURRENT SYMPTOM: "Have you had dizziness before?" If so, ask: "When was the last time?" "What happened that time?"     Yes several times before 10. OTHER SYMPTOMS: "Do you have any other symptoms?" (e.g., fever, chest pain, vomiting, diarrhea, bleeding)       No complaints of other symptoms at this time 11. PREGNANCY: "Is there any chance you are pregnant?" "When was your last menstrual period?"       n/a  Protocols used: DIZZINESS [facility]

## 2018-07-02 NOTE — Telephone Encounter (Signed)
Please advise 

## 2018-07-03 ENCOUNTER — Other Ambulatory Visit: Payer: Self-pay | Admitting: Family

## 2018-07-03 MED ORDER — LISINOPRIL 40 MG PO TABS: ORAL_TABLET | ORAL | 0 refills | Status: DC

## 2018-07-03 NOTE — Addendum Note (Signed)
Addended by: Debbrah Alar on: 07/03/2018 01:43 PM   Modules accepted: Orders

## 2018-07-03 NOTE — Telephone Encounter (Signed)
I sent a 14 days supply of lisinopril. He should schedule follow up in the office. If ongoing dizziness symptoms needs to be seen asap in our office if openings or urgent care if no openings.

## 2018-07-03 NOTE — Telephone Encounter (Signed)
Advised patient of refill and to call us or go to urgent care asap if dizziness continues. He reports no more dizziness at this time. He was at work when I called. Patient verbalizes understanding urgency of evaluation of dizziness reocurs.

## 2018-07-06 ENCOUNTER — Ambulatory Visit (HOSPITAL_BASED_OUTPATIENT_CLINIC_OR_DEPARTMENT_OTHER): Payer: 59

## 2018-07-08 ENCOUNTER — Telehealth: Payer: Self-pay | Admitting: Family

## 2018-07-08 NOTE — Telephone Encounter (Signed)
-----   Message from Katha Hamming sent at 07/08/2018  8:51 AM EDT ----- Regarding: ultrasound Jaykub was a no show for his MRI abdomen 07/06/18.   Thanks, Hoyle Sauer

## 2018-07-08 NOTE — Telephone Encounter (Signed)
Could you please contact pt and give him number to call to reschedule MRI.  I think it is important that he complete.

## 2018-07-08 NOTE — Telephone Encounter (Signed)
Called patient but n/a, lm for him to call radiology at [phone]

## 2018-07-20 ENCOUNTER — Encounter (HOSPITAL_BASED_OUTPATIENT_CLINIC_OR_DEPARTMENT_OTHER): Payer: Self-pay

## 2018-07-20 ENCOUNTER — Ambulatory Visit (HOSPITAL_BASED_OUTPATIENT_CLINIC_OR_DEPARTMENT_OTHER)
Admission: RE | Admit: 2018-07-20 | Discharge: 2018-07-20 | Disposition: A | Discharge status: Home / Self Care | Payer: 59 | Source: Ambulatory Visit | Attending: Family | Admitting: Family

## 2018-07-20 DIAGNOSIS — N281 Cyst of kidney, acquired: Secondary | ICD-10-CM

## 2018-07-22 ENCOUNTER — Telehealth: Payer: Self-pay | Admitting: Family

## 2018-07-22 MED ORDER — ALPRAZOLAM 0.5 MG PO TABS: ORAL_TABLET | ORAL | 0 refills | Status: DC

## 2018-07-22 NOTE — Telephone Encounter (Signed)
Please let pt know I sent rx for xanax to be taken 30 minutes prior to his MRI. He will need somebody to drive him home after he takes the medication.

## 2018-07-22 NOTE — Telephone Encounter (Signed)
Ok to take 2 tabs, please contact pharmacy and let them know that he can take 1-2 tabs of xanax prior to procedure.  Dispense 2 tabs

## 2018-07-22 NOTE — Telephone Encounter (Signed)
Patient advised, he reports "he had MRIs before and one tablet doesn't do it, he will need 2" please advised if ok to double rx.

## 2018-07-22 NOTE — Telephone Encounter (Signed)
-----   Message from Katha Hamming sent at 07/22/2018  9:02 AM EDT ----- Regarding: MRI abd w/wo Milik was a no show for his MRI 07/06/18.    He was r/s for 07/20/18 and could not go through the scan because of claustrophobia.  He has been r/s for 08/03/18 and has requested some claustrophobia medication to help him.  Thanks, Hoyle Sauer

## 2018-07-23 ENCOUNTER — Other Ambulatory Visit: Payer: Self-pay

## 2018-07-23 MED ORDER — ALPRAZOLAM 0.5 MG PO TABS: ORAL_TABLET | ORAL | 0 refills | Status: DC

## 2018-07-24 NOTE — Telephone Encounter (Signed)
New directions called in to the pharmacy, advised patient of change.

## 2018-08-03 ENCOUNTER — Ambulatory Visit (HOSPITAL_BASED_OUTPATIENT_CLINIC_OR_DEPARTMENT_OTHER): Admission: RE | Admit: 2018-08-03 | Payer: 59 | Source: Ambulatory Visit

## 2018-08-07 ENCOUNTER — Encounter: Payer: Self-pay | Admitting: Family

## 2018-08-07 ENCOUNTER — Telehealth: Payer: Self-pay | Admitting: Family

## 2018-08-07 NOTE — Telephone Encounter (Signed)
-----   Message from Katha Hamming sent at 08/05/2018  8:55 AM EDT ----- Regarding: MR ABD W/WO Elenore Rota no showed for his MRI abd this past Saturday, 08/03/18.  This was his third r/s in a row.   He will need a new creatinine whenever  he r/s because it will be 6 weeks tomorrow.   Thanks, Hoyle Sauer

## 2018-11-18 ENCOUNTER — Emergency Department (HOSPITAL_BASED_OUTPATIENT_CLINIC_OR_DEPARTMENT_OTHER)
Admission: EM | Admit: 2018-11-18 | Discharge: 2018-11-18 | Disposition: A | Discharge status: Home / Self Care | Payer: 59 | Attending: Emergency Medicine | Admitting: Emergency Medicine

## 2018-11-18 ENCOUNTER — Other Ambulatory Visit: Payer: Self-pay

## 2018-11-18 ENCOUNTER — Emergency Department (HOSPITAL_BASED_OUTPATIENT_CLINIC_OR_DEPARTMENT_OTHER): Payer: 59

## 2018-11-18 ENCOUNTER — Encounter (HOSPITAL_BASED_OUTPATIENT_CLINIC_OR_DEPARTMENT_OTHER): Payer: Self-pay | Admitting: Emergency Medicine

## 2018-11-18 DIAGNOSIS — I10 Essential (primary) hypertension: Secondary | ICD-10-CM | POA: Insufficient documentation

## 2018-11-18 DIAGNOSIS — S92524A Nondisplaced fracture of medial phalanx of right lesser toe(s), initial encounter for closed fracture: Secondary | ICD-10-CM | POA: Diagnosis not present

## 2018-11-18 DIAGNOSIS — Y999 Unspecified external cause status: Secondary | ICD-10-CM | POA: Insufficient documentation

## 2018-11-18 DIAGNOSIS — M7989 Other specified soft tissue disorders: Secondary | ICD-10-CM | POA: Diagnosis not present

## 2018-11-18 DIAGNOSIS — M79644 Pain in right finger(s): Secondary | ICD-10-CM | POA: Diagnosis not present

## 2018-11-18 DIAGNOSIS — Y929 Unspecified place or not applicable: Secondary | ICD-10-CM | POA: Insufficient documentation

## 2018-11-18 DIAGNOSIS — Y9384 Activity, sleeping: Secondary | ICD-10-CM | POA: Insufficient documentation

## 2018-11-18 DIAGNOSIS — X500XXA Overexertion from strenuous movement or load, initial encounter: Secondary | ICD-10-CM | POA: Insufficient documentation

## 2018-11-18 DIAGNOSIS — S90934A Unspecified superficial injury of right lesser toe(s), initial encounter: Secondary | ICD-10-CM | POA: Diagnosis present

## 2018-11-18 DIAGNOSIS — F17210 Nicotine dependence, cigarettes, uncomplicated: Secondary | ICD-10-CM | POA: Insufficient documentation

## 2018-11-18 DIAGNOSIS — Z79899 Other long term (current) drug therapy: Secondary | ICD-10-CM | POA: Insufficient documentation

## 2018-11-18 MED ORDER — HYDROCODONE-ACETAMINOPHEN 5-325 MG PO TABS: 1.0000 | ORAL_TABLET | ORAL | 0 refills | Status: DC | PRN

## 2018-11-18 NOTE — ED Provider Notes (Signed)
[facility] EMERGENCY DEPARTMENT Provider Note   CSN: [id number] Arrival date & time: 11/18/18  5726     History   Chief Complaint Chief Complaint  Patient presents with  . Toe Pain    HPI Alexander SACHSE is a 53 y.o. male.  HPI Patient presents with right second toe pain.  Began yesterday.  States he just woke up with it.  Has not had pain like this before.  Worse with movement.  Worse with walking.  No other pain.  No other injury.  No fevers. Past Medical History:  Diagnosis Date  . Benign prostatic hyperplasia   . Colon polyps    adenomatous  . Frequent headaches   . H. pylori infection   . HTN (hypertension) 12/28/2015  . Multiple gastric ulcers   . Renal lesion 03/26/2017   Stable, following with serial MRI's.   . Schatzki's ring     Patient Active Problem List   Diagnosis Date Noted  . Renal lesion 03/26/2017  . Migraine 08/14/2016  . Carpal tunnel syndrome 03/28/2016  . Bilateral knee pain 01/28/2016  . H. pylori duodenitis 01/24/2016  . Kidney lesion, native, left 01/24/2016  . Epigastric abdominal tenderness 12/28/2015  . Anxiety state 12/28/2015  . BPH (benign prostatic hyperplasia) 12/28/2015  . HTN (hypertension) 12/28/2015  . Benign prostatic hyperplasia with urinary obstruction 02/22/2015  . Hypertriglyceridemia 01/08/2015  . Primary osteoarthritis of both knees 12/28/2014  . Current tobacco use 12/25/2014  . Hypercholesterolemia 12/25/2014  . H/O gastric ulcer 12/25/2014  . Acid reflux 12/25/2014  . AA (alcohol abuse) 12/25/2014  . Elevated prostate specific antigen (PSA) 12/18/2013    History reviewed. No pertinent surgical history.      Home Medications    Prior to Admission medications   Medication Sig Start Date End Date Taking? Authorizing Provider  omeprazole (PRILOSEC) 10 MG capsule Take 10 mg by mouth daily.   Yes [provider]  amLODipine (NORVASC) 5 MG tablet TAKE 1 TABLET BY MOUTH ONCE DAILY 04/29/18    Debbrah Alar, NP  HYDROcodone-acetaminophen (NORCO/VICODIN) 5-325 MG tablet Take 1-2 tablets by mouth every 4 (four) hours as needed. 11/18/18   Davonna Belling, MD  lisinopril (PRINIVIL,ZESTRIL) 40 MG tablet TAKE 1 TABLET BY MOUTH ONCE DAILY 07/03/18   Debbrah Alar, NP    Family History Family History  Problem Relation Age of Onset  . Cancer Mother        colon  . Rheum arthritis Mother   . Cancer Father        leukemia  . Alzheimer's disease Maternal Grandfather     Social History Social History   Tobacco Use  . Smoking status: Current Every Day Smoker    Packs/day: 0.50    Years: 35.00    Pack years: 17.50    Types: Cigarettes  . Smokeless tobacco: Never Used  Substance Use Topics  . Alcohol use: No    Alcohol/week: 0.0 standard drinks    Comment: occassional  . Drug use: No    Frequency: 3.0 times per week     Allergies   Patient has no known allergies.   Review of Systems Review of Systems  Constitutional: Negative for appetite change.  Gastrointestinal: Negative for abdominal pain.  Musculoskeletal:       Right second toe pain.  Neurological: Negative for weakness and numbness.     Physical Exam Updated Vital Signs BP 122/90 (BP Location: Right Arm)   Pulse 61   Temp 98.5 F (  36.9 C) (Oral)   Resp 16   Ht 5\' 9"  (1.753 m)   Wt 83.9 kg   SpO2 100%   BMI 27.32 kg/m   Physical Exam Musculoskeletal:     Comments: Tenderness over right second toe.  Some swelling.  No erythema.  No fluctuance.  Pain over the DIP and PIP joint.  No real tenderness over MTP joint.  Skin intact.  Pulse intact on foot with good capillary refill on his toe.  Neurological:     Mental Status: He is alert.     Comments: Sensation intact over right second toe.      ED Treatments / Results  Labs (all labs ordered are listed, but only abnormal results are displayed) Labs Reviewed - No data to display  EKG None  Radiology Dg Toe 2nd Right  Result  Date: 11/18/2018 CLINICAL DATA:  Pain and swelling EXAM: RIGHT SECOND TOE:3 V COMPARISON:  None. FINDINGS: Frontal, oblique, and lateral views were obtained. There is an avulsion arising from the lateral aspect of the distal portion of the second middle phalanx. No other fracture is appreciable. No dislocation. Joint spaces appear normal. No erosive change. IMPRESSION: Avulsion arising from the lateral aspect of the distal portion of the second middle phalanx. No other fracture. No dislocation. No appreciable arthropathy. Electronically Signed   By: Lowella Grip III M.D.   On: 11/18/2018 09:29    Procedures Procedures (including critical care time)  Medications Ordered in ED Medications - No data to display   Initial Impression / Assessment and Plan / ED Course  I have reviewed the triage vital signs and the nursing notes.  Pertinent labs & imaging results that were available during my care of the patient were reviewed by me and considered in my medical decision making (see chart for details).     Patient with toe pain.  Initially said no injury but after finding an avulsion fracture on x-ray states he did hit his toe on the bed the night before.  I think likely is fracture.  Will treat with postop shoe and pain control.  Follow-up with sports medicine as needed.  Final Clinical Impressions(s) / ED Diagnoses   Final diagnoses:  Closed nondisplaced fracture of middle phalanx of lesser toe of right foot, initial encounter    ED Discharge Orders         Ordered    HYDROcodone-acetaminophen (NORCO/VICODIN) 5-325 MG tablet  Every 4 hours PRN     11/18/18 1007           Davonna Belling, MD 11/18/18 1014

## 2018-11-18 NOTE — ED Triage Notes (Signed)
Pain and swelling to right 2nd toe since yesterday.  No known injury.  No hx of same.

## 2018-11-18 NOTE — ED Notes (Signed)
Patient transported to X-ray 

## 2018-12-31 ENCOUNTER — Other Ambulatory Visit: Payer: Self-pay | Admitting: Family

## 2019-01-29 ENCOUNTER — Telehealth: Payer: Self-pay | Admitting: Family

## 2019-01-29 NOTE — Telephone Encounter (Signed)
Please advise pt that he is due for follow up and offer to schedule him a virtual OV.

## 2019-01-29 NOTE — Telephone Encounter (Signed)
PATIENT SCHEDULED FOR TUESDAY

## 2019-02-04 ENCOUNTER — Ambulatory Visit (INDEPENDENT_AMBULATORY_CARE_PROVIDER_SITE_OTHER): Payer: 59 | Admitting: Family

## 2019-02-04 DIAGNOSIS — K219 Gastro-esophageal reflux disease without esophagitis: Secondary | ICD-10-CM | POA: Diagnosis not present

## 2019-02-04 DIAGNOSIS — I10 Essential (primary) hypertension: Secondary | ICD-10-CM

## 2019-02-04 DIAGNOSIS — N401 Enlarged prostate with lower urinary tract symptoms: Secondary | ICD-10-CM

## 2019-02-04 MED ORDER — FINASTERIDE 5 MG PO TABS: 5.0000 mg | ORAL_TABLET | Freq: Every day | ORAL | 1 refills | Status: AC

## 2019-02-04 MED ORDER — AMLODIPINE BESYLATE 5 MG PO TABS: 5.0000 mg | ORAL_TABLET | Freq: Every day | ORAL | 1 refills | Status: DC

## 2019-02-04 MED ORDER — LISINOPRIL 40 MG PO TABS: 40.0000 mg | ORAL_TABLET | Freq: Every day | ORAL | 1 refills | Status: DC

## 2019-02-04 MED ORDER — OMEPRAZOLE 40 MG PO CPDR: 40.0000 mg | DELAYED_RELEASE_CAPSULE | Freq: Every day | ORAL | 1 refills | Status: DC

## 2019-02-04 NOTE — Progress Notes (Signed)
Virtual Visit via Video Note  I connected with@ on 02/04/19 at  4:20 PM EDT by a video enabled telemedicine application and verified that I am speaking with the correct person using two identifiers. This visit type was conducted due to national recommendations for restrictions regarding the COVID-19 Pandemic (e.g. social distancing).  This format is felt to be most appropriate for this patient at this time.   I discussed the limitations of evaluation and management by telemedicine and the availability of in person appointments. The patient expressed understanding and agreed to proceed.  Only the patient and myself were on today's video visit. The patient was at home and I was in my office at the time of today's visit.   History of Present Illness:  HTN- current bp meds include amlodipine 5mg , lisinopril 40mg .  126/73  was the last time he checked his blood pressure.  Was on 3/8.    Reports that he is  BP Readings from Last 3 Encounters:  11/18/18 (!) 128/91  12/07/17 (!) 137/96  05/24/17 96/62   BPH- reports that has been out of finasteride. Reports nocturia.    GERD-having breakthrough heart burn despite taking otc omeprazole. Had to switch due to ranitidine recall.    Observations/Objective:   Gen: Awake, alert, no acute distress Resp: Breathing is even and non-labored Psych: calm/pleasant demeanor Neuro: Alert and Oriented x 3, + facial symmetry, speech is clear.   Assessment and Plan:  HTN- home readings are good. He will send me an updated reading when he gets his batteries replaced in his BP cuff. Continue current meds. Plan follow up in 3 months for CPX with labs.  BPH- + nocturia. Restart finasteride. He had been following with Urology (Dr. Heather Roberts) however he has not been seen by them in several years. Plan to repeat PSA at his cpx this summer and if elevated will need follow up with urology.   GERD- uncontrolled. Will increase omeprazole from 20mg  to 40mg  once  daily. Discussed low sodium GERD friendly diet.   Follow Up Instructions:   (addendum- 85% of the visit was video- we switched to telephone at the very end due to audio issues with the video platform). I discussed the assessment and treatment plan with the patient. The patient was provided an opportunity to ask questions and all were answered. The patient agreed with the plan and demonstrated an understanding of the instructions.   The patient was advised to call back or seek an in-person evaluation if the symptoms worsen or if the condition fails to improve as anticipated.    Nance Pear, NP

## 2019-11-19 ENCOUNTER — Telehealth: Payer: Self-pay | Admitting: Family

## 2019-11-19 NOTE — Telephone Encounter (Signed)
Pt walked in states he is high risk for COVID due to his underlining condit. Pt states he needs a letter to help him with a program that can help with meds due to not having insurance.. Patient needs letter by Friday if possible. Please advise patient when letter can be picked up.

## 2019-11-21 ENCOUNTER — Telehealth: Payer: Self-pay

## 2019-11-21 NOTE — Telephone Encounter (Signed)
Called patient a few times but no answer, lvm for patient to call us back.

## 2019-11-21 NOTE — Telephone Encounter (Signed)
See letter.

## 2019-11-21 NOTE — Telephone Encounter (Signed)
Patient advised letter ready for pick up at the front desk. He said he will come pick up today.

## 2019-11-25 ENCOUNTER — Encounter (HOSPITAL_BASED_OUTPATIENT_CLINIC_OR_DEPARTMENT_OTHER): Payer: Self-pay | Admitting: Emergency Medicine

## 2019-11-25 ENCOUNTER — Emergency Department (HOSPITAL_BASED_OUTPATIENT_CLINIC_OR_DEPARTMENT_OTHER)
Admission: EM | Admit: 2019-11-25 | Discharge: 2019-11-25 | Disposition: A | Discharge status: Home / Self Care | Payer: 59 | Attending: Emergency Medicine | Admitting: Emergency Medicine

## 2019-11-25 ENCOUNTER — Other Ambulatory Visit: Payer: Self-pay

## 2019-11-25 DIAGNOSIS — F17210 Nicotine dependence, cigarettes, uncomplicated: Secondary | ICD-10-CM | POA: Insufficient documentation

## 2019-11-25 DIAGNOSIS — I10 Essential (primary) hypertension: Secondary | ICD-10-CM

## 2019-11-25 MED ORDER — AMLODIPINE BESYLATE 5 MG PO TABS: 5.0000 mg | ORAL_TABLET | Freq: Every day | ORAL | 1 refills | Status: AC

## 2019-11-25 MED ORDER — OMEPRAZOLE 40 MG PO CPDR: 40.0000 mg | DELAYED_RELEASE_CAPSULE | Freq: Every day | ORAL | 1 refills | Status: AC

## 2019-11-25 MED ORDER — AMLODIPINE BESYLATE 5 MG PO TABS: 5.0000 mg | ORAL_TABLET | Freq: Every day | ORAL | 1 refills | Status: DC

## 2019-11-25 MED ORDER — LISINOPRIL 40 MG PO TABS: 40.0000 mg | ORAL_TABLET | Freq: Every day | ORAL | 1 refills | Status: AC

## 2019-11-25 NOTE — ED Notes (Signed)
Pt on monitor 

## 2019-11-25 NOTE — ED Triage Notes (Signed)
Pt states he has been out of his medication for HTN for several months.  Pt c/o headache and dizziness.  Pt lost his insurance and has been unable to F/U with PCP due to lack of insurance.

## 2019-11-25 NOTE — ED Triage Notes (Signed)
During triage, pt does admit to intermittent chest pain, but not severe.

## 2019-12-02 NOTE — ED Provider Notes (Signed)
[facility] EMERGENCY DEPARTMENT Provider Note   CSN: [id number] Arrival date & time: 11/25/19  1012     History Chief Complaint  Patient presents with  . Hypertension  . Chest Pain    Alexander Hendricks is a 54 y.o. male.  HPI   54 year old male with hypertension.  History of the same.  Is been off his medications for several months due to lack of insurance.  He otherwise has no acute complaints.  States he needs work note.  Denies any acute pain.  No dyspnea.  No urinary complaints.  No unusual swelling.  Past Medical History:  Diagnosis Date  . Benign prostatic hyperplasia   . Colon polyps    adenomatous  . Frequent headaches   . H. pylori infection   . HTN (hypertension) 12/28/2015  . Multiple gastric ulcers   . Renal lesion 03/26/2017   Stable, following with serial MRI's.   . Schatzki's ring     Patient Active Problem List   Diagnosis Date Noted  . Renal lesion 03/26/2017  . Migraine 08/14/2016  . Carpal tunnel syndrome 03/28/2016  . Bilateral knee pain 01/28/2016  . H. pylori duodenitis 01/24/2016  . Kidney lesion, native, left 01/24/2016  . Epigastric abdominal tenderness 12/28/2015  . Anxiety state 12/28/2015  . BPH (benign prostatic hyperplasia) 12/28/2015  . HTN (hypertension) 12/28/2015  . Benign prostatic hyperplasia with urinary obstruction 02/22/2015  . Hypertriglyceridemia 01/08/2015  . Primary osteoarthritis of both knees 12/28/2014  . Current tobacco use 12/25/2014  . Hypercholesterolemia 12/25/2014  . H/O gastric ulcer 12/25/2014  . Acid reflux 12/25/2014  . AA (alcohol abuse) 12/25/2014  . Elevated prostate specific antigen (PSA) 12/18/2013    History reviewed. No pertinent surgical history.     Family History  Problem Relation Age of Onset  . Cancer Mother        colon  . Rheum arthritis Mother   . Cancer Father        leukemia  . Alzheimer's disease Maternal Grandfather     Social History   Tobacco Use  . Smoking  status: Current Every Day Smoker    Packs/day: 0.50    Years: 35.00    Pack years: 17.50    Types: Cigarettes  . Smokeless tobacco: Never Used  Substance Use Topics  . Alcohol use: No    Alcohol/week: 0.0 standard drinks    Comment: occassional  . Drug use: No    Frequency: 3.0 times per week    Home Medications Prior to Admission medications   Medication Sig Start Date End Date Taking? Authorizing Provider  amLODipine (NORVASC) 5 MG tablet Take 1 tablet (5 mg total) by mouth daily. 11/25/19   Virgel Manifold, MD  finasteride (PROSCAR) 5 MG tablet Take 1 tablet (5 mg total) by mouth daily. 02/04/19   Debbrah Alar, NP  lisinopril (ZESTRIL) 40 MG tablet Take 1 tablet (40 mg total) by mouth daily. 11/25/19   Virgel Manifold, MD  omeprazole (PRILOSEC) 40 MG capsule Take 1 capsule (40 mg total) by mouth daily. 11/25/19   Virgel Manifold, MD    Allergies    Patient has no known allergies.  Review of Systems   Review of Systems All systems reviewed and negative, other than as noted in HPI.  Physical Exam Updated Vital Signs BP (!) 155/109   Pulse 68   Temp 98.2 F (36.8 C) (Oral)   Resp 16   Ht 5\' 9"  (1.753 m)   Wt 81.6 kg  SpO2 100%   BMI 26.58 kg/m   Physical Exam Vitals and nursing note reviewed.  Constitutional:      General: He is not in acute distress.    Appearance: He is well-developed.  HENT:     Head: Normocephalic and atraumatic.  Eyes:     General:        Right eye: No discharge.        Left eye: No discharge.     Conjunctiva/sclera: Conjunctivae normal.  Cardiovascular:     Rate and Rhythm: Normal rate and regular rhythm.     Heart sounds: Normal heart sounds. No murmur. No friction rub. No gallop.   Pulmonary:     Effort: Pulmonary effort is normal. No respiratory distress.     Breath sounds: Normal breath sounds.  Abdominal:     General: There is no distension.     Palpations: Abdomen is soft.     Tenderness: There is no abdominal tenderness.    Musculoskeletal:        General: No tenderness.     Cervical back: Neck supple.  Skin:    General: Skin is warm and dry.  Neurological:     Mental Status: He is alert.  Psychiatric:        Behavior: Behavior normal.        Thought Content: Thought content normal.     ED Results / Procedures / Treatments   Labs (all labs ordered are listed, but only abnormal results are displayed) Labs Reviewed - No data to display  EKG EKG Interpretation  Date/Time:  Tuesday November 25 2019 10:34:40 EST Ventricular Rate:  59 PR Interval:    QRS Duration: 81 QT Interval:  404 QTC Calculation: 401 R Axis:   32 Text Interpretation: Sinus rhythm Baseline wander in lead(s) V4 Confirmed by Virgel Manifold [phone]) on 11/25/2019 10:42:20 AM Also confirmed by Virgel Manifold [phone]), editor Gean Quint [phone])  on 11/26/2019 8:20:35 AM   Radiology No results found.  Procedures Procedures (including critical care time)  Medications Ordered in ED Medications - No data to display  ED Course  I have reviewed the triage vital signs and the nursing notes.  Pertinent labs & imaging results that were available during my care of the patient were reviewed by me and considered in my medical decision making (see chart for details).    MDM Rules/Calculators/A&P                     54 year old male with asymptomatic hypertension.  Prescribed medications he was previously taking.  He needs to reestablish PCP care.  Return precautions discussed. Final Clinical Impression(s) / ED Diagnoses Final diagnoses:  Essential hypertension    Rx / DC Orders ED Discharge Orders         Ordered    amLODipine (NORVASC) 5 MG tablet  Daily,   Status:  Discontinued     11/25/19 1055    lisinopril (ZESTRIL) 40 MG tablet  Daily     11/25/19 1055    omeprazole (PRILOSEC) 40 MG capsule  Daily     11/25/19 1055    amLODipine (NORVASC) 5 MG tablet  Daily     11/25/19 1059           Virgel Manifold,  MD 12/02/19 [phone]

## 2021-02-10 ENCOUNTER — Encounter: Payer: Self-pay | Admitting: Internal Medicine
